# Patient Record
Sex: Female | Born: 1975 | Hispanic: No | Marital: Married | Smoking: Never smoker
Health system: Southern US, Community
[De-identification: ages and names within clinical notes are randomized; demographics above are authoritative.]

## PROBLEM LIST (undated history)

## (undated) DIAGNOSIS — F909 Attention-deficit hyperactivity disorder, unspecified type: Secondary | ICD-10-CM

---

## 2009-08-17 LAB — CONVERTED CEMR LAB

## 2010-05-01 ENCOUNTER — Ambulatory Visit: Payer: Self-pay | Admitting: Family Medicine

## 2010-05-01 DIAGNOSIS — F411 Generalized anxiety disorder: Secondary | ICD-10-CM | POA: Insufficient documentation

## 2010-05-01 DIAGNOSIS — M6740 Ganglion, unspecified site: Secondary | ICD-10-CM | POA: Insufficient documentation

## 2010-05-01 DIAGNOSIS — R51 Headache: Secondary | ICD-10-CM

## 2010-05-01 DIAGNOSIS — R519 Headache, unspecified: Secondary | ICD-10-CM | POA: Insufficient documentation

## 2010-05-01 DIAGNOSIS — D649 Anemia, unspecified: Secondary | ICD-10-CM

## 2010-05-06 ENCOUNTER — Ambulatory Visit: Payer: Self-pay | Admitting: Family Medicine

## 2010-05-06 LAB — CONVERTED CEMR LAB
Glucose, Urine, Semiquant: NEGATIVE
Nitrite: NEGATIVE
Specific Gravity, Urine: 1.02
WBC Urine, dipstick: NEGATIVE
pH: 6.5

## 2010-05-07 LAB — CONVERTED CEMR LAB
Albumin: 4.3 g/dL (ref 3.5–5.2)
Alkaline Phosphatase: 51 units/L (ref 39–117)
Basophils Relative: 0.4 % (ref 0.0–3.0)
CO2: 29 meq/L (ref 19–32)
Calcium: 9.8 mg/dL (ref 8.4–10.5)
Chloride: 102 meq/L (ref 96–112)
Eosinophils Absolute: 0.1 10*3/uL (ref 0.0–0.7)
Glucose, Bld: 84 mg/dL (ref 70–99)
HCT: 39 % (ref 36.0–46.0)
Hemoglobin: 13.4 g/dL (ref 12.0–15.0)
Lymphocytes Relative: 28.6 % (ref 12.0–46.0)
Lymphs Abs: 2.1 10*3/uL (ref 0.7–4.0)
MCHC: 34.4 g/dL (ref 30.0–36.0)
MCV: 96.5 fL (ref 78.0–100.0)
Neutro Abs: 4.6 10*3/uL (ref 1.4–7.7)
Potassium: 5.1 meq/L (ref 3.5–5.1)
RBC: 4.04 M/uL (ref 3.87–5.11)
Sodium: 140 meq/L (ref 135–145)
TSH: 1.63 microintl units/mL (ref 0.35–5.50)
Total CHOL/HDL Ratio: 2
Total Protein: 6.2 g/dL (ref 6.0–8.3)

## 2010-05-26 ENCOUNTER — Telehealth: Payer: Self-pay | Admitting: Family Medicine

## 2010-09-16 NOTE — Progress Notes (Signed)
Summary: seasickness patches  Phone Note Call from Patient Call back at Home Phone [phone]   Caller: Patient Call For: Danise Edge MD Summary of Call: pt is going on 7 day cruise requesting sea sickness patches call into [facility]. Also left same message triage line.  Rudy Jew, RN  May 26, 2010 4:40 PM   Initial call taken by: Heron Sabins,  May 26, 2010 4:24 PM  Follow-up for Phone Call        scopolamine patches change q 3 days. Already sent in rx, please notify Follow-up by: Danise Edge MD,  May 26, 2010 5:06 PM  Additional Follow-up for Phone Call Additional follow up Details #1::        Left message on pts vm stating RX was sent in and to call me if any addt questions. Additional Follow-up by: Josph Macho RMA,  May 27, 2010 9:01 AM    New/Updated Medications: TRANSDERM-SCOP 1.5 MG PT72 (SCOPOLAMINE BASE) 1 patch topically behind ear change every 3 days as needed n/v/disequilibrium Prescriptions: TRANSDERM-SCOP 1.5 MG PT72 (SCOPOLAMINE BASE) 1 patch topically behind ear change every 3 days as needed n/v/disequilibrium  #3 x 0   Entered and Authorized by:   Danise Edge MD   Signed by:   Danise Edge MD on 05/26/2010   Method used:   Electronically to        [facility]  [phone]* (retail)       [address]       [place], [place]  [address]       Ph: [phone] or [phone]       Fax: [phone]   RxID:   [id number]

## 2010-09-16 NOTE — Assessment & Plan Note (Signed)
Summary: BRAND NEW PT/TO EST/CJR   Vital Signs:  Patient profile:   35 year old female Menstrual status:  regular LMP:     04/21/2010 Height:      64 inches (162.56 cm) Weight:      143 pounds (65 kg) BMI:     24.63 O2 Sat:      99 % on Room air Temp:     97.7 degrees F (36.50 degrees C) oral Pulse rate:   67 / minute BP sitting:   102 / 68  (left arm) Cuff size:   regular  Vitals Entered By: Josph Macho RMA (May 01, 2010 10:47 AM)  O2 Flow:  Room air CC: Establish new patient/ Patient has alot of headaches and memory getting bad/ bump on left wrist/ CF Is Patient Diabetic? No LMP (date): 04/21/2010     Menstrual Status regular Enter LMP: 04/21/2010 Last PAP Result historical   History of Present Illness: patient is a 35 year old Caucasian for an inpatient he accompanied by her husband. She moved here in January of 2000 and from [place] to get married he relocate. She has generally been in good health but is presently having some headaches and some itching with forgetfulness she is here for checkup and discussion of some headaches and left wrist lesion. The left wrist lesion is hard mildly tender non-mobile and has been present for roughly a week no history of similar lesions in the past. No warmth, injury, change in the lesion since it started. She is struggling with some headaches on a frequent basis at this time roughly half the days of the week she has a headache to some degree. She started noticing headaches a little over a year ago and initially they were infrequent and swelling have worsened in frequency over the last year. She denied any head injury no history of headaches dating back years. She denies any visual changes, scotomata, photophobia unless the headaches are very extreme pain she has very mild photophobia. She denies photophobia, nausea, vomiting. She had an episode of her left leg feeling slightly numb on the anterior portion of the thigh yesterday but  that was a rigorous workout she rested. Overall she considers her self in good health does give an extensive family history which shows multiple family members with him substance abuse issues the patient herself is that her stroke in regard. She is concerned regarding her memory she will forget her left talus or things that she does not forget important things such as where she is going to pick up her child for details. She takes Excedrin extra strength for her headaches and is generally good relief. She does complain of some mild frontal pressure but no nasal congestion, no fevers, no chills, no ear pain, no throat pain, no chest pain, no palpitations, no shortness of breath, no GI or GU complaints are noted at today's visit. She does have an OB/GYN physician she has been following with.  Preventive Screening-Counseling & Management  Alcohol-Tobacco     Smoking Status: never  Caffeine-Diet-Exercise     Does Patient Exercise: yes      Drug Use:  no.    Current Medications (verified): 1)  Fish Oil .... Once Daily 2)  Multivitamin .... Once Daily 3)  Vitamin D .... Once Daily  Allergies (verified): No Known Drug Allergies  Past History:  Past Surgical History: Appendectomy  Family History: Father: 70, hyperlipidemia Mother: 39, A&W Siblings:  Sister: 54, A&W SSister: 61, A&W MGM: 80s, A&W,  smoker MGF: 28s, DM, stroke, h/o cig and alcohol abuse PGM: deceased@96 , smoker, alcohol abuse, dementia PGF: deceased in 53s, MI, cig and alcohol abuse Children: Daughter: 5yo, alive and well  Social History: Occupation: Futures trader Married Never Smoked Alcohol use-yes Drug use-no Regular seat belt use Regular exercise-yes, 5 days a week, cardio and weight training No dietary restrictions Occupation:  employed Smoking Status:  never Drug Use:  no Does Patient Exercise:  yes  Review of Systems  The patient denies anorexia, fever, weight loss, weight gain, vision loss, decreased  hearing, hoarseness, chest pain, syncope, dyspnea on exertion, peripheral edema, prolonged cough, hemoptysis, abdominal pain, melena, hematochezia, severe indigestion/heartburn, hematuria, incontinence, genital sores, muscle weakness, suspicious skin lesions, transient blindness, difficulty walking, depression, unusual weight change, abnormal bleeding, enlarged lymph nodes, angioedema, breast masses, and testicular masses.    Physical Exam  General:  Well-developed,well-nourished,in no acute distress; alert,appropriate and cooperative throughout examination Head:  Normocephalic and atraumatic without obvious abnormalities. No apparent alopecia or balding. Eyes:  No corneal or conjunctival inflammation noted. EOMI. Perrla. Funduscopic exam benign, without hemorrhages, exudates or papilledema. Vision grossly normal. Ears:  External ear exam shows no significant lesions or deformities.  Otoscopic examination reveals clear canals, tympanic membranes are intact bilaterally without bulging, retraction, inflammation or discharge. Hearing is grossly normal bilaterally. Nose:  External nasal examination shows no deformity or inflammation. Nasal mucosa are pink and moist without lesions or exudates. Mouth:  Oral mucosa and oropharynx without lesions or exudates.  Teeth in good repair. Neck:  No deformities, masses, or tenderness noted. Lungs:  Normal respiratory effort, chest expands symmetrically. Lungs are clear to auscultation, no crackles or wheezes. Heart:  Normal rate and regular rhythm. S1 and S2 normal without gallop, murmur, click, rub or other extra sounds. Abdomen:  Bowel sounds positive,abdomen soft and non-tender without masses, organomegaly or hernias noted. Msk:  No deformity or scoliosis noted of thoracic or lumbar spine.   Pulses:  R and L carotid,radial,femoral,dorsalis pedis and posterior tibial pulses are full and equal bilaterally Extremities:  No clubbing, cyanosis, edema, or deformity  noted with normal full range of motion of all joints.  1 cm firm nodule on left wrist at radial head Neurologic:  No cranial nerve deficits noted. Station and gait are normal. Plantar reflexes are down-going bilaterally. DTRs are symmetrical throughout. Sensory, motor and coordinative functions appear intact. Skin:  Intact without suspicious lesions or rashes Cervical Nodes:  No lymphadenopathy noted Psych:  Cognition and judgment appear intact. Alert and cooperative with normal attention span and concentration. No apparent delusions, illusions, hallucinations   Impression & Recommendations:  Problem # 1:  HEADACHE (ICD-784.0) Likely multifactorial encouraged 7-8 hours of sleep, which she presently does not get, recommend 64 oz of clear fluids and avoid caffeine which she drinks daily. Mostly diet SunDrops. eat small frequent meals and cont Excedrine for now. Will reassess after lifestyle change or if symptoms.  Problem # 2:  GANGLION CYST, WRIST, LEFT (ICD-727.41) Try daily massage with Aspercreme and consider referral later if no resolution  Problem # 3:  Preventive Health Care (ICD-V70.0) Agrees to return for fasting labs and reevaluation after lifestyle changes.  Complete Medication List: 1)  Fish Oil  .... Once daily 2)  Multivitamin  .... Once daily 3)  Vitamin D  .... Once daily  Patient Instructions: 1)  Please schedule a follow-up appointment in 1 month.  2)  BMP prior to visit, ICD-9: v70.0 for all 3)  Hepatic Panel prior to visit  ICD-9:  4)  Lipid panel prior to visit ICD-9 :  5)  TSH prior to visit ICD-9 :  6)  CBC w/ Diff prior to visit ICD-9 :  7)  Stop caffeine ues 8)  Increase clear fluids to 64 oz daily 9)  sleep 7-8 hours each night 10)  Continue regular exercise  Preventive Care Screening  Pap Smear:    Date:  08/17/2009    Results:  historical

## 2011-12-22 ENCOUNTER — Ambulatory Visit: Payer: Self-pay | Admitting: Family

## 2011-12-23 ENCOUNTER — Ambulatory Visit (INDEPENDENT_AMBULATORY_CARE_PROVIDER_SITE_OTHER): Payer: BC Managed Care – PPO | Admitting: Family

## 2011-12-23 ENCOUNTER — Encounter: Payer: Self-pay | Admitting: Family

## 2011-12-23 VITALS — BP 110/60 | Ht 63.25 in | Wt 140.0 lb

## 2011-12-23 DIAGNOSIS — R41840 Attention and concentration deficit: Secondary | ICD-10-CM

## 2011-12-23 MED ORDER — AMPHETAMINE-DEXTROAMPHETAMINE 10 MG PO TABS: 10.0000 mg | ORAL_TABLET | Freq: Two times a day (BID) | ORAL | Status: DC

## 2011-12-23 NOTE — Progress Notes (Signed)
  Subjective:    Patient ID: Andrea Willis, female    DOB: 11/05/1975, 36 y.o.   MRN: [id number]  HPI  36 year old female, nonsmoker, patient to the practice and the concerns of attention deficit. Is having difficulty remembering things and completing tasks. Reports always struggling with this issues as a child. But tended to make A's and B's in school. 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 Her son has ADD and takes Adderall. She denies any feelings of helplessness, hopelessness, thoughts of death or dying.  Review of Systems  Constitutional: Negative.   Respiratory: Negative.   Cardiovascular: Negative.   Gastrointestinal: Negative.   Musculoskeletal: Negative.   Skin: Negative.   Neurological: Negative.   Hematological: Negative.   Psychiatric/Behavioral: Positive for decreased concentration.   No past medical history on file.  History   Social History  . Marital Status: Married    Spouse Name: N/A    Number of Children: N/A  . Years of Education: N/A   Occupational History  . Not on file.   Social History Main Topics  . Smoking status: Never Smoker   . Smokeless tobacco: Not on file  . Alcohol Use: Yes  . Drug Use: No  . Sexually Active: Not on file   Other Topics Concern  . Not on file   Social History Narrative  . No narrative on file    No past surgical history on file.  No family history on file.  No Known Allergies  Current Outpatient Prescriptions on File Prior to Visit  Medication Sig Dispense Refill  . amphetamine-dextroamphetamine (ADDERALL, 10MG ,) 10 MG tablet Take 1 tablet (10 mg total) by mouth 2 (two) times daily.  60 tablet  0    BP 110/60  Ht 5' 3.25" (1.607 m)  Wt 140 lb (63.504 kg)  BMI 24.60 kg/m2chart    Objective:   Physical Exam  Constitutional: She is oriented to person, place, and time. She appears well-developed and well-nourished.  Neck: Normal range of motion. Neck supple.  Cardiovascular:  Normal rate, regular rhythm and normal heart sounds.   Pulmonary/Chest: Effort normal and breath sounds normal.  Musculoskeletal: Normal range of motion.  Neurological: She is alert and oriented to person, place, and time.  Skin: Skin is warm and dry.  Psychiatric: She has a normal mood and affect.          Assessment & Plan:  Assessment: Attention Deficit Disorder  Plan: Will start Adderall 10mg  BID. Recheck  In 3 weeks. Discussed possible side effects and potential for abuse and dependence. Call the office with any questions of concerns.

## 2011-12-23 NOTE — Patient Instructions (Signed)
Attention Deficit Hyperactivity Disorder Attention deficit hyperactivity disorder (ADHD) is a problem with behavior issues based on the way the brain functions (neurobehavioral disorder). It is a common reason for behavior and academic problems in school. CAUSES  The cause of ADHD is unknown in most cases. It may run in families. It sometimes can be associated with learning disabilities and other behavioral problems. SYMPTOMS  There are 3 types of ADHD. The 3 types and some of the symptoms include:  Inattentive   Gets bored or distracted easily.   Loses or forgets things. Forgets to hand in homework.   Has trouble organizing or completing tasks.   Difficulty staying on task.   An inability to organize daily tasks and school work.   Leaving projects, chores, or homework unfinished.   Trouble paying attention or responding to details. Careless mistakes.   Difficulty following directions. Often seems like is not listening.   Dislikes activities that require sustained attention (like chores or homework).   Hyperactive-impulsive   Feels like it is impossible to sit still or stay in a seat. Fidgeting with hands and feet.   Trouble waiting turn.   Talking too much or out of turn. Interruptive.   Speaks or acts impulsively.   Aggressive, disruptive behavior.   Constantly busy or on the go, noisy.   Combined   Has symptoms of both of the above.  Often children with ADHD feel discouraged about themselves and with school. They often perform well below their abilities in school. These symptoms can cause problems in home, school, and in relationships with peers. As children get older, the excess motor activities can calm down, but the problems with paying attention and staying organized persist. Most children do not outgrow ADHD but with good treatment can learn to cope with the symptoms. DIAGNOSIS  When ADHD is suspected, the diagnosis should be made by professionals trained in  ADHD.  Diagnosis will include:  Ruling out other reasons for the child's behavior.   The caregivers will check with the child's school and check their medical records.   They will talk to teachers and parents.   Behavior rating scales for the child will be filled out by those dealing with the child on a daily basis.  A diagnosis is made only after all information has been considered. TREATMENT  Treatment usually includes behavioral treatment often along with medicines. It may include stimulant medicines. The stimulant medicines decrease impulsivity and hyperactivity and increase attention. Other medicines used include antidepressants and certain blood pressure medicines. Most experts agree that treatment for ADHD should address all aspects of the child's functioning. Treatment should not be limited to the use of medicines alone. Treatment should include structured classroom management. The parents must receive education to address rewarding good behavior, discipline, and limit-setting. Tutoring or behavioral therapy or both should be available for the child. If untreated, the disorder can have long-term serious effects into adolescence and adulthood. HOME CARE INSTRUCTIONS   Often with ADHD there is a lot of frustration among the family in dealing with the illness. There is often blame and anger that is not warranted. This is a life long illness. There is no way to prevent ADHD. In many cases, because the problem affects the family as a whole, the entire family may need help. A therapist can help the family find better ways to handle the disruptive behaviors and promote change. If the child is young, most of the therapist's work is with the parents. Parents will   learn techniques for coping with and improving their child's behavior. Sometimes only the child with the ADHD needs counseling. Your caregivers can help you make these decisions.   Children with ADHD may need help in organizing. Some  helpful tips include:   Keep routines the same every day from wake-up time to bedtime. Schedule everything. This includes homework and playtime. This should include outdoor and indoor recreation. Keep the schedule on the refrigerator or a bulletin board where it is frequently seen. Mark schedule changes as far in advance as possible.   Have a place for everything and keep everything in its place. This includes clothing, backpacks, and school supplies.   Encourage writing down assignments and bringing home needed books.   Offer your child a well-balanced diet. Breakfast is especially important for school performance. Children should avoid drinks with caffeine including:   Soft drinks.   Coffee.   Tea.   However, some older children (adolescents) may find these drinks helpful in improving their attention.   Children with ADHD need consistent rules that they can understand and follow. If rules are followed, give small rewards. Children with ADHD often receive, and expect, criticism. Look for good behavior and praise it. Set realistic goals. Give clear instructions. Look for activities that can foster success and self-esteem. Make time for pleasant activities with your child. Give lots of affection.   Parents are their children's greatest advocates. Learn as much as possible about ADHD. This helps you become a stronger and better advocate for your child. It also helps you educate your child's teachers and instructors if they feel inadequate in these areas. Parent support groups are often helpful. A national group with local chapters is called CHADD (Children and Adults with Attention Deficit Hyperactivity Disorder).  PROGNOSIS  There is no cure for ADHD. Children with the disorder seldom outgrow it. Many find adaptive ways to accommodate the ADHD as they mature. SEEK MEDICAL CARE IF:  Your child has repeated muscle twitches, cough or speech outbursts.   Your child has sleep problems.   Your  child has a marked loss of appetite.   Your child develops depression.   Your child has new or worsening behavioral problems.   Your child develops dizziness.   Your child has a racing heart.   Your child has stomach pains.   Your child develops headaches.  Document Released: 07/24/2002 Document Revised: 07/23/2011 Document Reviewed: 03/05/2008 Exit[employer] Patient Information 2012 [employer]. 

## 2012-01-09 ENCOUNTER — Emergency Department (HOSPITAL_COMMUNITY): Payer: BC Managed Care – PPO

## 2012-01-09 ENCOUNTER — Emergency Department (HOSPITAL_COMMUNITY)
Admission: EM | Admit: 2012-01-09 | Discharge: 2012-01-09 | Disposition: A | Discharge status: Home / Self Care | Payer: BC Managed Care – PPO | Attending: Emergency Medicine | Admitting: Emergency Medicine

## 2012-01-09 ENCOUNTER — Encounter (HOSPITAL_COMMUNITY): Payer: Self-pay

## 2012-01-09 DIAGNOSIS — M25511 Pain in right shoulder: Secondary | ICD-10-CM

## 2012-01-09 DIAGNOSIS — W08XXXA Fall from other furniture, initial encounter: Secondary | ICD-10-CM | POA: Insufficient documentation

## 2012-01-09 DIAGNOSIS — M25519 Pain in unspecified shoulder: Secondary | ICD-10-CM | POA: Insufficient documentation

## 2012-01-09 DIAGNOSIS — S060XAA Concussion with loss of consciousness status unknown, initial encounter: Secondary | ICD-10-CM | POA: Insufficient documentation

## 2012-01-09 DIAGNOSIS — S060X9A Concussion with loss of consciousness of unspecified duration, initial encounter: Secondary | ICD-10-CM | POA: Insufficient documentation

## 2012-01-09 DIAGNOSIS — W19XXXA Unspecified fall, initial encounter: Secondary | ICD-10-CM

## 2012-01-09 DIAGNOSIS — F909 Attention-deficit hyperactivity disorder, unspecified type: Secondary | ICD-10-CM | POA: Insufficient documentation

## 2012-01-09 HISTORY — DX: Attention-deficit hyperactivity disorder, unspecified type: F90.9

## 2012-01-09 MED ORDER — TETANUS-DIPHTH-ACELL PERTUSSIS 5-2.5-18.5 LF-MCG/0.5 IM SUSP
0.5000 mL | Freq: Once | INTRAMUSCULAR | Status: AC
  Administered 2012-01-09: 0.5 mL via INTRAMUSCULAR
  Filled 2012-01-09: qty 0.5

## 2012-01-09 MED ORDER — ACETAMINOPHEN 325 MG PO TABS
650.0000 mg | ORAL_TABLET | Freq: Once | ORAL | Status: AC
  Administered 2012-01-09: 650 mg via ORAL
  Filled 2012-01-09: qty 2

## 2012-01-09 MED ORDER — IBUPROFEN 800 MG PO TABS
ORAL_TABLET | ORAL | Status: AC
  Administered 2012-01-09: 800 mg via ORAL
  Filled 2012-01-09: qty 1

## 2012-01-09 NOTE — Discharge Instructions (Signed)
Shoulder Pain The shoulder is a ball and socket joint. The muscles and tendons (rotator cuff) are what keep the shoulder in its joint and stable. This collection of muscles and tendons holds in the head (ball) of the humerus (upper arm bone) in the fossa (cup) of the scapula (shoulder blade). Today no reason was found for your shoulder pain. Often pain in the shoulder may be treated conservatively with temporary immobilization. For example, holding the shoulder in one place using a sling for rest. Physical therapy may be needed if problems continue. HOME CARE INSTRUCTIONS   Apply ice to the sore area for 15 to 20 minutes, 3 to 4 times per day for the first 2 days. Put the ice in a plastic bag. Place a towel between the bag of ice and your skin.   If you have or were given a shoulder sling and straps, do not remove for as long as directed by your caregiver or until you see a caregiver for a follow-up examination. If you need to remove it to shower or bathe, move your arm as little as possible.   Sleep on several pillows at night to lessen swelling and pain.   Only take over-the-counter or prescription medicines for pain, discomfort, or fever as directed by your caregiver.   Keep any follow-up appointments in order to avoid any type of permanent shoulder disability or chronic pain problems.  SEEK MEDICAL CARE IF:   Pain in your shoulder increases or new pain develops in your arm, hand, or fingers.   Your hand or fingers are colder than your other hand.   You do not obtain pain relief with the medications or your pain becomes worse.  SEEK IMMEDIATE MEDICAL CARE IF:   Your arm, hand, or fingers are numb or tingling.   Your arm, hand, or fingers are swollen, painful, or turn white or blue.   You develop chest pain or shortness of breath.  MAKE SURE YOU:   Understand these instructions.   Will watch your condition.   Will get help right away if you are not doing well or get worse.    Document Released: 05/13/2005 Document Revised: 07/23/2011 Document Reviewed: 07/18/2011 [facility] Patient Information 2012 [place], [place].Head Injury, Adult You have had a head injury that does not appear serious at this time. A concussion is a state of changed mental ability, usually from a blow to the head. You should take clear liquids for the rest of the day and then resume your regular diet. You should not take sedatives or alcoholic beverages for as long as directed by your caregiver after discharge. After injuries such as yours, most problems occur within the first 24 hours. SYMPTOMS These minor symptoms may be experienced after discharge:  Memory difficulties.   Dizziness.   Headaches.   Double vision.   Hearing difficulties.   Depression.   Tiredness.   Weakness.   Difficulty with concentration.  If you experience any of these problems, you should not be alarmed. A concussion requires a few days for recovery. Many patients with head injuries frequently experience such symptoms. Usually, these problems disappear without medical care. If symptoms last for more than one day, notify your caregiver. See your caregiver sooner if symptoms are becoming worse rather than better. HOME CARE INSTRUCTIONS   During the next 24 hours you must stay with someone who can watch you for the warning signs listed below.  Although it is unlikely that serious side effects will occur, you should  be aware of signs and symptoms which may necessitate your return to this location. Side effects may occur up to 7 - 10 days following the injury. It is important for you to carefully monitor your condition and contact your caregiver or seek immediate medical attention if there is a change in your condition. SEEK IMMEDIATE MEDICAL CARE IF:   There is confusion or drowsiness.   You can not awaken the injured person.   There is nausea (feeling sick to your stomach) or continued, forceful vomiting.   You  notice dizziness or unsteadiness which is getting worse, or inability to walk.   You have convulsions or unconsciousness.   You experience severe, persistent headaches not relieved by over-the-counter or prescription medicines for pain. (Do not take aspirin as this impairs clotting abilities). Take other pain medications only as directed.   You can not use arms or legs normally.   There is clear or bloody discharge from the nose or ears.  MAKE SURE YOU:   Understand these instructions.   Will watch your condition.   Will get help right away if you are not doing well or get worse.  Document Released: 08/03/2005 Document Revised: 07/23/2011 Document Reviewed: 06/21/2009 [facility] Patient Information 2012 [place], [place].Concussion and Brain Injury A blow or jolt to the head can disrupt the normal function of the brain. This type of brain injury is often called a "concussion" or a "closed head injury." Concussions are usually not life-threatening. Even so, the effects of a concussion can be serious.  CAUSES  A concussion is caused by a blunt blow to the head. The blow might be direct or indirect as described below.  Direct blow (running into another player during a soccer game, being hit in a fight, or hitting your head on a hard surface).   Indirect blow (when your head moves rapidly and violently back and forth like in a car crash).  SYMPTOMS  The brain is very complex. Every head injury is different. Some symptoms may appear right away. Other symptoms may not show up for days or weeks after the concussion. The signs of concussion can be hard to notice. Early on, problems may be missed by patients, family members, and caregivers. You may look fine even though you are acting or feeling differently.  These symptoms are usually temporary, but may last for days, weeks, or even longer. Symptoms include:  Mild headaches that will not go away.   Having more trouble than usual with:    Remembering things.   Paying attention or concentrating.   Organizing daily tasks.   Making decisions and solving problems.   Slowness in thinking, acting, speaking, or reading.   Getting lost or easily confused.   Feeling tired all the time or lacking energy (fatigue).   Feeling drowsy.   Sleep disturbances.   Sleeping more than usual.   Sleeping less than usual.   Trouble falling asleep.   Trouble sleeping (insomnia).   Loss of balance or feeling lightheaded or dizzy.   Nausea or vomiting.   Numbness or tingling.   Increased sensitivity to:   Sounds.   Lights.   Distractions.  Other symptoms might include:  Vision problems or eyes that tire easily.   Diminished sense of taste or smell.   Ringing in the ears.   Mood changes such as feeling sad, anxious, or listless.   Becoming easily irritated or angry for little or no reason.   Lack of motivation.  DIAGNOSIS  Your  caregiver can usually diagnose a concussion or mild brain injury based on your description of your injury and your symptoms.  Your evaluation might include:  A brain scan to look for signs of injury to the brain. Even if the test shows no injury, you may still have a concussion.   Blood tests to be sure other problems are not present.  TREATMENT   People with a concussion need to be examined and evaluated. Most people with concussions are treated in an emergency department, urgent care, or clinic. Some people must stay in the hospital overnight for further treatment.   Your caregiver will send you home with important instructions to follow. Be sure to carefully follow them.   Tell your caregiver if you are already taking any medicines (prescription, over-the-counter, or natural remedies), or if you are drinking alcohol or taking illegal drugs. Also, talk with your caregiver if you are taking blood thinners (anticoagulants) or aspirin. These drugs may increase your chances of  complications. All of this is important information that may affect treatment.   Only take over-the-counter or prescription medicines for pain, discomfort, or fever as directed by your caregiver.  PROGNOSIS  How fast people recover from brain injury varies from person to person. Although most people have a good recovery, how quickly they improve depends on many factors. These factors include how severe their concussion was, what part of the brain was injured, their age, and how healthy they were before the concussion.  Because all head injuries are different, so is recovery. Most people with mild injuries recover fully. Recovery can take time. In general, recovery is slower in older persons. Also, persons who have had a concussion in the past or have other medical problems may find that it takes longer to recover from their current injury. Anxiety and depression may also make it harder to adjust to the symptoms of brain injury. HOME CARE INSTRUCTIONS  Return to your normal activities slowly, not all at once. You must give your body and brain enough time for recovery.  Get plenty of sleep at night, and rest during the day. Rest helps the brain to heal.   Avoid staying up late at night.   Keep the same bedtime hours on weekends and weekdays.   Take daytime naps or rest breaks when you feel tired.   Limit activities that require a lot of thought or concentration (brain or cognitive rest). This includes:   Homework or job-related work.   Watching TV.   Computer work.   Avoid activities that could lead to a second brain injury, such as contact or recreational sports, until your caregiver says it is okay. Even after your brain injury has healed, you should protect yourself from having another concussion.   Ask your caregiver when you can return to your normal activities such as driving, bicycling, or operating heavy equipment. Your ability to react may be slower after a brain injury.   Talk  with your caregiver about when you can return to work or school.   Inform your teachers, school nurse, school counselor, coach, Event organiser, or work Production designer, theatre/television/film about your injury, symptoms, and restrictions. They should be instructed to report:   Increased problems with attention or concentration.   Increased problems remembering or learning new information.   Increased time needed to complete tasks or assignments.   Increased irritability or decreased ability to cope with stress.   Increased symptoms.   Take only those medicines that your caregiver has approved.  Do not drink alcohol until your caregiver says you are well enough to do so. Alcohol and certain other drugs may slow your recovery and can put you at risk of further injury.   If it is harder than usual to remember things, write them down.   If you are easily distracted, try to do one thing at a time. For example, do not try to watch TV while fixing dinner.   Talk with family members or close friends when making important decisions.   Keep all follow-up appointments. Repeated evaluation of your symptoms is recommended for your recovery.  PREVENTION  Protect your head from future injury. It is very important to avoid another head or brain injury before you have recovered. In rare cases, another injury has lead to permanent brain damage, brain swelling, or death. Avoid injuries by using:  Seatbelts when riding in a car.   Alcohol only in moderation.   A helmet when biking, skiing, skateboarding, skating, or doing similar activities.   Safety measures in your home.   Remove clutter and tripping hazards from floors and stairways.   Use grab bars in bathrooms and handrails by stairs.   Place non-slip mats on floors and in bathtubs.   Improve lighting in dim areas.  SEEK MEDICAL CARE IF:  A head injury can cause lingering symptoms. You should seek medical care if you have any of the following symptoms for more than  3 weeks after your injury or are planning to return to sports:  Chronic headaches.   Dizziness or balance problems.   Nausea.   Vision problems.   Increased sensitivity to noise or light.   Depression or mood swings.   Anxiety or irritability.   Memory problems.   Difficulty concentrating or paying attention.   Sleep problems.   Feeling tired all the time.  SEEK IMMEDIATE MEDICAL CARE IF:  You have had a blow or jolt to the head and you (or your family or friends) notice:  Severe or worsening headaches.   Weakness (even if only in one hand or one leg or one part of the face), numbness, or decreased coordination.   Repeated vomiting.   Increased sleepiness or passing out.   One black center of the eye (pupil) is larger than the other.   Convulsions (seizures).   Slurred speech.   Increasing confusion, restlessness, agitation, or irritability.   Lack of ability to recognize people or places.   Neck pain.   Difficulty being awakened.   Unusual behavior changes.   Loss of consciousness.  Older adults with a brain injury may have a higher risk of serious complications such as a blood clot on the brain. Headaches that get worse or an increase in confusion are signs of this complication. If these signs occur, see a caregiver right away. MAKE SURE YOU:   Understand these instructions.   Will watch your condition.   Will get help right away if you are not doing well or get worse.  FOR MORE INFORMATION  Several groups help people with brain injury and their families. They provide information and put people in touch with local resources. These include support groups, rehabilitation services, and a variety of health care professionals. Among these groups, the Brain Injury Association (BIA, www.biausa.org) has a Secretary/administrator that gathers scientific and educational information and works on a national level to help people with brain injury.  Document Released:  10/24/2003 Document Revised: 07/23/2011 Document Reviewed: 03/21/2008 [facility] Patient Information 2012 [place]. 

## 2012-01-09 NOTE — ED Provider Notes (Signed)
History     CSN: [id number]  Arrival date & time 01/09/12  4098   First MD Initiated Contact with Patient 01/09/12 1830      Chief Complaint  Patient presents with  . Fall    (Consider location/radiation/quality/duration/timing/severity/associated sxs/prior treatment) HPI Comments: Larey Seat from bench swing and landed on all 4s.  Contrary to nursing note.  Confirmed with EMS.  Bench seat was 6 feet above the dirt on which the pt landed.  Did not land head first.  Pt was initial confused but now feeling better.  Patient is a 36 y.o. female presenting with fall. The history is provided by the patient.  Fall The accident occurred less than 1 hour ago. Incident: swinging on a bench swing and fell onto dirt. Distance fallen: 6 feet. She landed on dirt. There was no blood loss. Point of impact: landed on all 4s. Pain location: mild headache and mild right shoulder pain. The pain is mild. Pertinent negatives include no abdominal pain.    Past Medical History  Diagnosis Date  . Attention deficit hyperactivity disorder (ADHD)     History reviewed. No pertinent past surgical history.  History reviewed. No pertinent family history.  History  Substance Use Topics  . Smoking status: Never Smoker   . Smokeless tobacco: Not on file  . Alcohol Use: Yes    OB History    Grav Para Term Preterm Abortions TAB SAB Ect Mult Living                  Review of Systems  Constitutional: Negative for activity change.  HENT: Negative for congestion.   Eyes: Negative for visual disturbance.  Respiratory: Negative for chest tightness and shortness of breath.   Cardiovascular: Negative for chest pain and leg swelling.  Gastrointestinal: Negative for abdominal pain.  Genitourinary: Negative for dysuria.  Skin: Negative for rash.  Neurological: Negative for syncope.  Psychiatric/Behavioral: Negative for behavioral problems.    Allergies  Review of patient's allergies indicates no known  allergies.  Home Medications   Current Outpatient Rx  Name Route Sig Dispense Refill  . AMPHETAMINE-DEXTROAMPHET ER 20 MG PO CP24 Oral Take 20 mg by mouth daily.    . AMPHETAMINE-DEXTROAMPHETAMINE 10 MG PO TABS Oral Take 10 mg by mouth daily.    Marland Kitchen CRANBERRY PO Oral Take 1 capsule by mouth daily.    . OMEGA-3 FATTY ACIDS 1000 MG PO CAPS Oral Take 1 g by mouth daily.    Carma Leaven M PLUS PO TABS Oral Take 1 tablet by mouth daily.      LMP 12/11/2011  Physical Exam  Constitutional: She is oriented to person, place, and time. She appears well-developed and well-nourished.  HENT:  Head: Normocephalic and atraumatic.  Eyes: Conjunctivae and EOM are normal. Pupils are equal, round, and reactive to light. No scleral icterus.  Neck: Normal range of motion. Neck supple.  Cardiovascular: Normal rate and regular rhythm.  Exam reveals no gallop and no friction rub.   No murmur heard. Pulmonary/Chest: Effort normal and breath sounds normal. No respiratory distress. She has no wheezes. She has no rales. She exhibits no tenderness.  Abdominal: Soft. She exhibits no distension and no mass. There is no tenderness. There is no rebound and no guarding.  Musculoskeletal: Normal range of motion.       Mild ttp in R shoulder.  No C, T, L spine ttp.  Neurological: She is alert and oriented to person, place, and time. She has normal  reflexes. No cranial nerve deficit.  Skin: Skin is warm and dry. No rash noted.       Several small abrasions  Psychiatric: She has a normal mood and affect. Her behavior is normal. Judgment and thought content normal.    ED Course  Procedures (including critical care time)  Labs Reviewed - No data to display Dg Shoulder Right  01/09/2012  *RADIOLOGY REPORT*  Clinical Data: Fall off swing onto right shoulder.  Shoulder pain.  RIGHT SHOULDER - 2+ VIEW  Comparison:  None.  Findings:  There is no evidence of fracture or dislocation.  There is no evidence of arthropathy or other  focal bone abnormality. Soft tissues are unremarkable.  IMPRESSION: Negative.  Original Report Authenticated By: Danae Orleans, M.D.     1. Concussion   2. Right shoulder pain   3. Fall       MDM  Fell from bench swing and landed on all 4s.  Contrary to nursing note.  Confirmed with EMS.  Bench seat was 6 feet above the dirt on which the pt landed.  Did not land head first.  Pt was initially confused but now feeling better.  VSS and well appearing.  Mild ttp in R shoulder and a few small abrasions.  Otherwise without injury.  Shoulder xray without fx.  Presentation c/w concussion.  Minimal headache at this time.  Ambulatory in ED without difficulty.  Normal neuro exam.   Offered head CT but pt felt comfortable with d/c home.  Gave had injury precautions.  Gradual return of activity and PCP f/u.  Pt comfortable with plan and will follow up.         Army Chaco, MD 01/09/12 2115

## 2012-01-09 NOTE — ED Notes (Signed)
Pt fell 10 feet to the ground from a fall off a swing, confusion in the beginning, just now rememebering what month it is

## 2012-01-10 NOTE — ED Provider Notes (Signed)
I saw and evaluated the patient, reviewed the resident's note and I agree with the findings and plan.   .Face to face Exam:  General:  Awake HEENT:  Atraumatic Resp:  Normal effort Abd:  Nondistended Neuro:No focal weakness Lymph: No adenopathy   Nelia Shi, MD 01/10/12 [phone]

## 2012-01-11 ENCOUNTER — Emergency Department (HOSPITAL_BASED_OUTPATIENT_CLINIC_OR_DEPARTMENT_OTHER)
Admission: EM | Admit: 2012-01-11 | Discharge: 2012-01-11 | Disposition: A | Discharge status: Home / Self Care | Payer: BC Managed Care – PPO | Attending: Emergency Medicine | Admitting: Emergency Medicine

## 2012-01-11 ENCOUNTER — Emergency Department (HOSPITAL_BASED_OUTPATIENT_CLINIC_OR_DEPARTMENT_OTHER): Payer: BC Managed Care – PPO

## 2012-01-11 ENCOUNTER — Encounter (HOSPITAL_BASED_OUTPATIENT_CLINIC_OR_DEPARTMENT_OTHER): Payer: Self-pay | Admitting: *Deleted

## 2012-01-11 DIAGNOSIS — S0990XA Unspecified injury of head, initial encounter: Secondary | ICD-10-CM | POA: Insufficient documentation

## 2012-01-11 DIAGNOSIS — W07XXXA Fall from chair, initial encounter: Secondary | ICD-10-CM | POA: Insufficient documentation

## 2012-01-11 DIAGNOSIS — F0781 Postconcussional syndrome: Secondary | ICD-10-CM | POA: Insufficient documentation

## 2012-01-11 DIAGNOSIS — R404 Transient alteration of awareness: Secondary | ICD-10-CM | POA: Insufficient documentation

## 2012-01-11 MED ORDER — HYDROCODONE-ACETAMINOPHEN 5-325 MG PO TABS
2.0000 | ORAL_TABLET | ORAL | Status: AC | PRN
Start: 2012-01-11 — End: 2012-01-21

## 2012-01-11 NOTE — ED Notes (Signed)
Pt states she fell out of a swing hitting her head and had loc. Taken by ems to [facility], dx with concussion. Pt states she did not have a head ct, and is requesting that today. Pt states she cont to have ha.

## 2012-01-11 NOTE — Discharge Instructions (Signed)
.  Post-Concussion Syndrome   Post-concussion syndrome means you have problems after a head injury. The problems can last for weeks or months. The problems usually go away on their own over time. HOME CARE   Only take medicines as told by your doctor. Do not take aspirin.   Sleep with your head raised (elevated) to help with headaches.   Avoid activities that can cause another head injury. Do not play football, hockey, do martial arts, or ride horses until your doctor says it is okay.   Keep all doctor visits as told.  GET HELP RIGHT AWAY IF:  You feel confused or very sleepy.   You cannot wake the injured person.   You feel sick to your stomach (nauseous) or keep throwing up (vomiting).   You feel like you are moving when you are not (vertigo).   You notice the injured person's eyes moving back and forth very fast.   You start shaking (convulsions) or pass out (faint).   You have very bad headaches that do not get better with medicine.   You cannot use your arms or legs normally.   The black center of your eyes (pupils) change size.   You have clear or bloody fluid coming from your nose or ears.   Your problems get worse, not better.  MAKE SURE YOU:  Understand these instructions.   Will watch your condition.   Will get help right away if you are not doing well or get worse.  Document Released: 09/10/2004 Document Revised: 07/23/2011 Document Reviewed: 02/19/2011 [facility] Patient Information 2012 [place], [place].

## 2012-01-11 NOTE — ED Provider Notes (Signed)
History  This chart was scribed for Andrea Shi, MD by Cherlynn Perches. The patient was seen in room MH02/MH02. Patient's care was started at 1656.  CSN: [id number]  Arrival date & time 01/11/12  1656   First MD Initiated Contact with Patient 01/11/12 1727      Chief Complaint  Patient presents with  . Head Injury    (Consider location/radiation/quality/duration/timing/severity/associated sxs/prior treatment) HPI  Najee Willis is a 36 y.o. female who presents to the Emergency Department complaining of 2 days of sudden onset, unchanged, constant headache with associated "wooziness" and light sensitivity. Pt was seen at [facility] 2 days ago after falling off a swing and losing consciousness. Pt reports that she was on a bench swing when the chain broke. She hit her head on the back of the chair and fell out of the chair onto her head and shoulder. Onlookers report that pt lost consciousness for about 20 seconds. A head CT was not performed in the [facility] ED, but patient was told to return if symptoms persist. Pt denies nausea, vomiting, fever, and further episodes of syncope. Pt has been taking ibuprofen for pain.  History reviewed. No pertinent past medical history.  History reviewed. No pertinent past surgical history.  History reviewed. No pertinent family history.  History  Substance Use Topics  . Smoking status: Not on file  . Smokeless tobacco: Not on file  . Alcohol Use: Not on file    OB History    Grav Para Term Preterm Abortions TAB SAB Ect Mult Living                  Review of Systems  Constitutional: Negative for fever and chills.  Respiratory: Negative for cough and shortness of breath.   Cardiovascular: Negative for chest pain.  Gastrointestinal: Negative for nausea, vomiting, abdominal pain and diarrhea.  Musculoskeletal: Negative for back pain.  Skin: Negative for rash.  Neurological: Positive for syncope (2 days ago), light-headedness ("woozy") and  headaches.       "wooziness"  Psychiatric/Behavioral: Negative for confusion.  All other systems reviewed and are negative.    Allergies  Review of patient's allergies indicates no known allergies.  Home Medications   Current Outpatient Rx  Name Route Sig Dispense Refill  . AMPHETAMINE-DEXTROAMPHETAMINE 10 MG PO TABS Oral Take 10 mg by mouth daily.    Marland Kitchen CRANBERRY PO Oral Take 2 tablets by mouth daily.    . IBUPROFEN 200 MG PO TABS Oral Take 800 mg by mouth every 6 (six) hours as needed. Patient used this medication for her headache.    . ONE-DAILY MULTI VITAMINS PO TABS Oral Take 1 tablet by mouth daily.    Marland Kitchen FISH OIL PO Oral Take 1 capsule by mouth daily.    Marland Kitchen HYDROCODONE-ACETAMINOPHEN 5-325 MG PO TABS Oral Take 2 tablets by mouth every 4 (four) hours as needed for pain. 10 tablet 0    Triage Vitals: BP 122/68  Pulse 90  Temp(Src) 98.4 F (36.9 C) (Oral)  Resp 20  Ht 5\' 3"  (1.6 m)  Wt 140 lb (63.504 kg)  BMI 24.80 kg/m2  SpO2 98%  LMP 12/12/2011  Physical Exam  Nursing note and vitals reviewed. Constitutional: She is oriented to person, place, and time. She appears well-developed and well-nourished. No distress.  HENT:  Head: Normocephalic and atraumatic.  Eyes: EOM are normal. Pupils are equal, round, and reactive to light.  Neck: Normal range of motion.  Cardiovascular: Normal rate and intact  distal pulses.   Pulmonary/Chest: No respiratory distress.  Abdominal: Normal appearance. She exhibits no distension.  Musculoskeletal: Normal range of motion.  Neurological: She is alert and oriented to person, place, and time. She has normal strength. No cranial nerve deficit or sensory deficit. Coordination normal. GCS eye subscore is 4. GCS verbal subscore is 5. GCS motor subscore is 6.       PERRL, EOM intact, finger to nose normal, cranial nerves 2-12 intact, normal gate  Skin: Skin is warm and dry. No rash noted.  Psychiatric: She has a normal mood and affect. Her  behavior is normal.    ED Course  Procedures (including critical care time)  DIAGNOSTIC STUDIES: Oxygen Saturation is 98% on room air, normal by my interpretation.    COORDINATION OF CARE: 5:33PM - Discussed exam findings with pt. Will get head CT. Pt agrees.    Labs Reviewed - No data to display Ct Head Wo Contrast  01/11/2012  *RADIOLOGY REPORT*  Clinical Data:  Larey Seat from swing  hitting head with loss of consciousness 01/09/2012.  Continued headache.  CT HEAD WITHOUT CONTRAST  Technique:  Contiguous axial images were obtained from the base of the skull through the vertex without contrast  Comparison:  None.  Findings:  The brain has a normal appearance without evidence for hemorrhage, acute infarction, hydrocephalus, or mass lesion.  There is no extra axial fluid collection.  The skull and paranasal sinuses are normal.  IMPRESSION: Normal CT of the head without contrast.  Original Report Authenticated By: Elsie Stain, M.D.     1. Postconcussive syndrome       MDM  I personally performed the services described in this documentation, which was scribed in my presence. The recorded information has been reviewed and considered.    Andrea Shi, MD 01/11/12 [phone]

## 2012-01-11 NOTE — ED Notes (Signed)
rx x 1 given for hydrocodone- ice pack given for home use- d/c with friend

## 2012-01-12 ENCOUNTER — Ambulatory Visit: Payer: BC Managed Care – PPO | Admitting: Family

## 2012-01-12 ENCOUNTER — Telehealth: Payer: Self-pay | Admitting: Family Medicine

## 2012-01-12 ENCOUNTER — Encounter (HOSPITAL_COMMUNITY): Payer: Self-pay

## 2012-01-12 NOTE — Telephone Encounter (Signed)
[facility] Triage Call Report Triage Record Num: [id number] Operator: Amy Head Patient Name: Andrea Willis Call Date & Time: 01/11/2012 4:01:51PM Patient Phone: [phone] PCP: Neta Mends. Panosh Patient Gender: Female PCP Fax : [phone] Patient DOB: 09-May-1976 [facility] Name: [facility] Reason for Call: Caller: Ilda Basset; PCP: Madelin Headings.; CB#: [phone]; Call regarding Head injury saturday;; DX with Concussion at [facility] on Saturday, 01/09/12. No MRI was done. Started breathing "funny" last night, "her head isn't getting any better and she is walking funny." Pt was on a swing at the park and chain broke, landed "like a rag doll", hitting her head neck and arms. LOC, memory loss which has since come back. Wants MRI done. Was told that if pt had any further sxs to call back. Called [facility] on [address] and they stated that pt needs a MD order. Is dizzy, can stand but not for long periods of time, needs assistance. Having trouble with balance and severe HA still. Spoke with Dr. Cato Mulligan, advised to go back to ED for evaluation and states that he can not order a CT or MRI without seeing the pt. Protocol(s) Used: Head Injury Recommended Outcome per Protocol: See ED Immediately Reason for Outcome: Severe or worsening headache since injury Care Advice: ~ Another adult should drive. ~ Do not give the patient anything to eat or drink. After a head or face injury, call EMS 911 if the person is unable to answer simple questions, can't waken easily, has change in behavior, vomits more than 2 hours after injury, has double vision or new onset of difficulty walking, weakness or numbness. ~ ~ IMMEDIATE ACTION Write down provider's name. List or place the following in a bag for transport with the patient: current prescription and/or nonprescription medications; alternative treatments, therapies and medications; and street drugs. ~ Call EMS 911 if  having a sudden, severe disabling headache OR if the person spontaneously verbalized this headache is "the worst headache of my life." ~ 01/11/2012 4:17:41PM Page 1 of 1 [id number]

## 2012-01-19 ENCOUNTER — Telehealth: Payer: Self-pay

## 2012-01-19 ENCOUNTER — Ambulatory Visit (INDEPENDENT_AMBULATORY_CARE_PROVIDER_SITE_OTHER): Payer: BC Managed Care – PPO | Admitting: Family

## 2012-01-19 ENCOUNTER — Encounter: Payer: Self-pay | Admitting: Family

## 2012-01-19 VITALS — BP 100/60 | Temp 98.4°F | Wt 142.5 lb

## 2012-01-19 DIAGNOSIS — S060X9A Concussion with loss of consciousness of unspecified duration, initial encounter: Secondary | ICD-10-CM

## 2012-01-19 DIAGNOSIS — F988 Other specified behavioral and emotional disorders with onset usually occurring in childhood and adolescence: Secondary | ICD-10-CM

## 2012-01-19 MED ORDER — AMPHETAMINE-DEXTROAMPHET ER 20 MG PO CP24: 20.0000 mg | ORAL_CAPSULE | ORAL | Status: DC

## 2012-01-19 NOTE — Telephone Encounter (Signed)
Pt was seen today and forgot to mention that she had a concussion 10 days ago.  A swing broke at the park and she was hit in the head and knocked unconscious.  She wants to know when it would be ok for her to go back to normal activity, including sex.

## 2012-01-19 NOTE — Patient Instructions (Signed)
Concussion and Brain Injury A blow or jolt to the head can disrupt the normal function of the brain. This type of brain injury is often called a "concussion" or a "closed head injury." Concussions are usually not life-threatening. Even so, the effects of a concussion can be serious.  CAUSES  A concussion is caused by a blunt blow to the head. The blow might be direct or indirect as described below.  Direct blow (running into another player during a soccer game, being hit in a fight, or hitting your head on a hard surface).   Indirect blow (when your head moves rapidly and violently back and forth like in a car crash).  SYMPTOMS  The brain is very complex. Every head injury is different. Some symptoms may appear right away. Other symptoms may not show up for days or weeks after the concussion. The signs of concussion can be hard to notice. Early on, problems may be missed by patients, family members, and caregivers. You may look fine even though you are acting or feeling differently.  These symptoms are usually temporary, but may last for days, weeks, or even longer. Symptoms include:  Mild headaches that will not go away.   Having more trouble than usual with:   Remembering things.   Paying attention or concentrating.   Organizing daily tasks.   Making decisions and solving problems.   Slowness in thinking, acting, speaking, or reading.   Getting lost or easily confused.   Feeling tired all the time or lacking energy (fatigue).   Feeling drowsy.   Sleep disturbances.   Sleeping more than usual.   Sleeping less than usual.   Trouble falling asleep.   Trouble sleeping (insomnia).   Loss of balance or feeling lightheaded or dizzy.   Nausea or vomiting.   Numbness or tingling.   Increased sensitivity to:   Sounds.   Lights.   Distractions.  Other symptoms might include:  Vision problems or eyes that tire easily.   Diminished sense of taste or smell.   Ringing  in the ears.   Mood changes such as feeling sad, anxious, or listless.   Becoming easily irritated or angry for little or no reason.   Lack of motivation.  DIAGNOSIS  Your caregiver can usually diagnose a concussion or mild brain injury based on your description of your injury and your symptoms.  Your evaluation might include:  A brain scan to look for signs of injury to the brain. Even if the test shows no injury, you may still have a concussion.   Blood tests to be sure other problems are not present.  TREATMENT   People with a concussion need to be examined and evaluated. Most people with concussions are treated in an emergency department, urgent care, or clinic. Some people must stay in the hospital overnight for further treatment.   Your caregiver will send you home with important instructions to follow. Be sure to carefully follow them.   Tell your caregiver if you are already taking any medicines (prescription, over-the-counter, or natural remedies), or if you are drinking alcohol or taking illegal drugs. Also, talk with your caregiver if you are taking blood thinners (anticoagulants) or aspirin. These drugs may increase your chances of complications. All of this is important information that may affect treatment.   Only take over-the-counter or prescription medicines for pain, discomfort, or fever as directed by your caregiver.  PROGNOSIS  How fast people recover from brain injury varies from person to person.   Although most people have a good recovery, how quickly they improve depends on many factors. These factors include how severe their concussion was, what part of the brain was injured, their age, and how healthy they were before the concussion.  Because all head injuries are different, so is recovery. Most people with mild injuries recover fully. Recovery can take time. In general, recovery is slower in older persons. Also, persons who have had a concussion in the past or have  other medical problems may find that it takes longer to recover from their current injury. Anxiety and depression may also make it harder to adjust to the symptoms of brain injury. HOME CARE INSTRUCTIONS  Return to your normal activities slowly, not all at once. You must give your body and brain enough time for recovery.  Get plenty of sleep at night, and rest during the day. Rest helps the brain to heal.   Avoid staying up late at night.   Keep the same bedtime hours on weekends and weekdays.   Take daytime naps or rest breaks when you feel tired.   Limit activities that require a lot of thought or concentration (brain or cognitive rest). This includes:   Homework or job-related work.   Watching TV.   Computer work.   Avoid activities that could lead to a second brain injury, such as contact or recreational sports, until your caregiver says it is okay. Even after your brain injury has healed, you should protect yourself from having another concussion.   Ask your caregiver when you can return to your normal activities such as driving, bicycling, or operating heavy equipment. Your ability to react may be slower after a brain injury.   Talk with your caregiver about when you can return to work or school.   Inform your teachers, school nurse, school counselor, coach, athletic trainer, or work manager about your injury, symptoms, and restrictions. They should be instructed to report:   Increased problems with attention or concentration.   Increased problems remembering or learning new information.   Increased time needed to complete tasks or assignments.   Increased irritability or decreased ability to cope with stress.   Increased symptoms.   Take only those medicines that your caregiver has approved.   Do not drink alcohol until your caregiver says you are well enough to do so. Alcohol and certain other drugs may slow your recovery and can put you at risk of further injury.    If it is harder than usual to remember things, write them down.   If you are easily distracted, try to do one thing at a time. For example, do not try to watch TV while fixing dinner.   Talk with family members or close friends when making important decisions.   Keep all follow-up appointments. Repeated evaluation of your symptoms is recommended for your recovery.  PREVENTION  Protect your head from future injury. It is very important to avoid another head or brain injury before you have recovered. In rare cases, another injury has lead to permanent brain damage, brain swelling, or death. Avoid injuries by using:  Seatbelts when riding in a car.   Alcohol only in moderation.   A helmet when biking, skiing, skateboarding, skating, or doing similar activities.   Safety measures in your home.   Remove clutter and tripping hazards from floors and stairways.   Use grab bars in bathrooms and handrails by stairs.   Place non-slip mats on floors and in bathtubs.     Improve lighting in dim areas.  SEEK MEDICAL CARE IF:  A head injury can cause lingering symptoms. You should seek medical care if you have any of the following symptoms for more than 3 weeks after your injury or are planning to return to sports:  Chronic headaches.   Dizziness or balance problems.   Nausea.   Vision problems.   Increased sensitivity to noise or light.   Depression or mood swings.   Anxiety or irritability.   Memory problems.   Difficulty concentrating or paying attention.   Sleep problems.   Feeling tired all the time.  SEEK IMMEDIATE MEDICAL CARE IF:  You have had a blow or jolt to the head and you (or your family or friends) notice:  Severe or worsening headaches.   Weakness (even if only in one hand or one leg or one part of the face), numbness, or decreased coordination.   Repeated vomiting.   Increased sleepiness or passing out.   One black center of the eye (pupil) is larger  than the other.   Convulsions (seizures).   Slurred speech.   Increasing confusion, restlessness, agitation, or irritability.   Lack of ability to recognize people or places.   Neck pain.   Difficulty being awakened.   Unusual behavior changes.   Loss of consciousness.  Older adults with a brain injury may have a higher risk of serious complications such as a blood clot on the brain. Headaches that get worse or an increase in confusion are signs of this complication. If these signs occur, see a caregiver right away. MAKE SURE YOU:   Understand these instructions.   Will watch your condition.   Will get help right away if you are not doing well or get worse.  FOR MORE INFORMATION  Several groups help people with brain injury and their families. They provide information and put people in touch with local resources. These include support groups, rehabilitation services, and a variety of health care professionals. Among these groups, the Brain Injury Association (BIA, www.biausa.org) has a national office that gathers scientific and educational information and works on a national level to help people with brain injury.  Document Released: 10/24/2003 Document Revised: 07/23/2011 Document Reviewed: 03/21/2008 ExitCare Patient Information 2012 [employer]. 

## 2012-01-19 NOTE — Telephone Encounter (Signed)
Per Oran Rein, advised pt to advance activities within comfortable limits, however DO NOT HIT YOUR HEAD!. Pt is aware and verbalized understanding

## 2012-01-19 NOTE — Progress Notes (Signed)
Subjective:    Patient ID: Andrea Willis, female    DOB: [birth year], 36 y.o.   MRN: [id number]  HPI 36 year old white female, nonsmoker is in following a traumatic brain injury/concussion when she fell out of the swimming week ago. She was seen at the emergency department. She had a negative CT scan. The patient lost consciousness, and had decreased awareness. She has since recovered. She denies any lightheadedness, dizziness, chest pain, palpitations, shortness of breath or edema. Since the accident, she's had a constant headache rate it a 3/10.   Review of Systems  Constitutional: Negative.   Eyes: Negative.  Negative for visual disturbance.  Respiratory: Negative.   Cardiovascular: Negative.   Gastrointestinal: Negative.   Musculoskeletal: Negative.   Skin: Negative.   Neurological: Positive for headaches.  Hematological: Negative.   Psychiatric/Behavioral: Negative.    Past Medical History  Diagnosis Date  . Attention deficit hyperactivity disorder (ADHD)     History   Social History  . Marital Status: Married    Spouse Name: N/A    Number of Children: N/A  . Years of Education: N/A   Occupational History  . Not on file.   Social History Main Topics  . Smoking status: Never Smoker   . Smokeless tobacco: Not on file  . Alcohol Use: Yes  . Drug Use: No  . Sexually Active: Not on file   Other Topics Concern  . Not on file   Social History Narrative   ** Merged History Encounter **     No past surgical history on file.  No family history on file.  No Known Allergies  Current Outpatient Prescriptions on File Prior to Visit  Medication Sig Dispense Refill  . CRANBERRY PO Take 1 capsule by mouth daily.      Marland Kitchen CRANBERRY PO Take 2 tablets by mouth daily.      . fish oil-omega-3 fatty acids 1000 MG capsule Take 1 g by mouth daily.      Marland Kitchen HYDROcodone-acetaminophen (NORCO) 5-325 MG per tablet Take 2 tablets by mouth every 4 (four) hours as needed for pain.  10  tablet  0  . ibuprofen (ADVIL,MOTRIN) 200 MG tablet Take 800 mg by mouth every 6 (six) hours as needed. Patient used this medication for her headache.      . Multiple Vitamin (MULTIVITAMIN) tablet Take 1 tablet by mouth daily.      . Multiple Vitamins-Minerals (MULTIVITAMINS THER. W/MINERALS) TABS Take 1 tablet by mouth daily.      . Omega-3 Fatty Acids (FISH OIL PO) Take 1 capsule by mouth daily.      Marland Kitchen DISCONTD: amphetamine-dextroamphetamine (ADDERALL XR) 20 MG 24 hr capsule Take 20 mg by mouth daily.      Marland Kitchen DISCONTD: amphetamine-dextroamphetamine (ADDERALL) 10 MG tablet Take 10 mg by mouth daily.      Marland Kitchen DISCONTD: amphetamine-dextroamphetamine (ADDERALL) 10 MG tablet Take 10 mg by mouth daily.        BP 100/60  Temp(Src) 98.4 F (36.9 C) (Oral)  Wt 142 lb 8 oz (64.638 kg)  LMP 04/29/2013chart    Objective:   Physical Exam  Constitutional: She is oriented to person, place, and time. She appears well-developed and well-nourished.  HENT:  Head: Normocephalic and atraumatic.  Right Ear: External ear normal.  Left Ear: External ear normal.  Nose: Nose normal.  Mouth/Throat: Oropharynx is clear and moist.  Eyes: Conjunctivae are normal.  Neck: Normal range of motion. Neck supple.  Cardiovascular: Normal rate, regular rhythm and  normal heart sounds.   Pulmonary/Chest: Effort normal and breath sounds normal.  Neurological: She is alert and oriented to person, place, and time. She has normal reflexes. She displays normal reflexes. No cranial nerve deficit. Coordination normal.  Skin: Skin is warm and dry.  Psychiatric: She has a normal mood and affect.          Assessment & Plan:  Assessment: Hospital F/U, Concussion, headache, ADD  Plan: Educated patient on traumatic brain injuries. She's aware that her headache and linger for up to 3 months. I've advised that she refrain from doing any activities that could potentially cause a second brain injury. She is currently off of her  Adderall but will resume the medication in a week. Patient advised to call the office with any questions or concerns. Recheck in 3 months and sooner when necessary.

## 2012-02-29 ENCOUNTER — Telehealth: Payer: Self-pay | Admitting: Family

## 2012-02-29 MED ORDER — AMPHETAMINE-DEXTROAMPHET ER 20 MG PO CP24: 20.0000 mg | ORAL_CAPSULE | ORAL | Status: DC

## 2012-02-29 NOTE — Telephone Encounter (Signed)
Rx printed and pt aware ready to pick up

## 2012-02-29 NOTE — Telephone Encounter (Signed)
Patient called stating that she need a refill of her adderall and she will be going out of town on Wednesday. Please assist and inform patient when available for pick up.

## 2012-04-01 ENCOUNTER — Telehealth: Payer: Self-pay | Admitting: Family

## 2012-04-01 ENCOUNTER — Other Ambulatory Visit: Payer: Self-pay | Admitting: Family Medicine

## 2012-04-01 MED ORDER — AMPHETAMINE-DEXTROAMPHET ER 20 MG PO CP24: 20.0000 mg | ORAL_CAPSULE | ORAL | Status: DC

## 2012-04-01 NOTE — Telephone Encounter (Signed)
Pt requesting a refill on amphetamine-dextroamphetamine (ADDERALL XR) 20 MG 24 hr capsule

## 2012-04-01 NOTE — Telephone Encounter (Signed)
Printed for PC to sign.  Patient here in the office to pick it up.

## 2012-05-09 ENCOUNTER — Telehealth: Payer: Self-pay | Admitting: Family

## 2012-05-09 MED ORDER — AMPHETAMINE-DEXTROAMPHET ER 20 MG PO CP24: 20.0000 mg | ORAL_CAPSULE | ORAL | Status: DC

## 2012-05-09 NOTE — Telephone Encounter (Signed)
Pt called to req refill of amphetamine-dextroamphetamine (ADDERALL XR) 20 MG 24 hr capsule

## 2012-06-10 ENCOUNTER — Telehealth: Payer: Self-pay | Admitting: Family

## 2012-06-10 MED ORDER — AMPHETAMINE-DEXTROAMPHET ER 20 MG PO CP24: 20.0000 mg | ORAL_CAPSULE | ORAL | Status: DC

## 2012-06-10 NOTE — Telephone Encounter (Signed)
Patient called stating that she need a refill of her adderall. Please assist.  °

## 2012-06-10 NOTE — Telephone Encounter (Signed)
Rx printed and ready for pick-up and pt aware

## 2012-07-19 ENCOUNTER — Encounter: Payer: Self-pay | Admitting: Family

## 2012-07-19 ENCOUNTER — Other Ambulatory Visit: Payer: Self-pay | Admitting: Family

## 2012-07-19 ENCOUNTER — Ambulatory Visit (INDEPENDENT_AMBULATORY_CARE_PROVIDER_SITE_OTHER): Payer: BC Managed Care – PPO | Admitting: Family

## 2012-07-19 VITALS — BP 122/68 | HR 92 | Temp 98.7°F | Wt 139.0 lb

## 2012-07-19 DIAGNOSIS — R5383 Other fatigue: Secondary | ICD-10-CM

## 2012-07-19 DIAGNOSIS — R110 Nausea: Secondary | ICD-10-CM

## 2012-07-19 DIAGNOSIS — R5381 Other malaise: Secondary | ICD-10-CM

## 2012-07-19 LAB — CBC WITH DIFFERENTIAL/PLATELET
Basophils Absolute: 0 10*3/uL (ref 0.0–0.1)
Eosinophils Absolute: 0 10*3/uL (ref 0.0–0.7)
Eosinophils Relative: 0.2 % (ref 0.0–5.0)
MCV: 96.1 fl (ref 78.0–100.0)
Monocytes Absolute: 0.5 10*3/uL (ref 0.1–1.0)
Neutrophils Relative %: 81 % — ABNORMAL HIGH (ref 43.0–77.0)
Platelets: 268 10*3/uL (ref 150.0–400.0)
RDW: 13 % (ref 11.5–14.6)
WBC: 13.2 10*3/uL — ABNORMAL HIGH (ref 4.5–10.5)

## 2012-07-19 LAB — HEPATIC FUNCTION PANEL
ALT: 20 U/L (ref 0–35)
Bilirubin, Direct: 0.1 mg/dL (ref 0.0–0.3)
Total Bilirubin: 0.7 mg/dL (ref 0.3–1.2)

## 2012-07-19 LAB — BASIC METABOLIC PANEL
BUN: 20 mg/dL (ref 6–23)
CO2: 29 mEq/L (ref 19–32)
Chloride: 100 mEq/L (ref 96–112)
Glucose, Bld: 123 mg/dL — ABNORMAL HIGH (ref 70–99)
Potassium: 3.8 mEq/L (ref 3.5–5.1)
Sodium: 137 mEq/L (ref 135–145)

## 2012-07-19 MED ORDER — ONDANSETRON 8 MG PO TBDP: 8.0000 mg | ORAL_TABLET | Freq: Three times a day (TID) | ORAL | Status: DC | PRN

## 2012-07-19 MED ORDER — AMPHETAMINE-DEXTROAMPHET ER 20 MG PO CP24: 20.0000 mg | ORAL_CAPSULE | ORAL | Status: DC

## 2012-07-19 NOTE — Telephone Encounter (Signed)
Pt needs new rx generic adderall xr 20 mg °

## 2012-07-19 NOTE — Patient Instructions (Addendum)
Nausea, Adult Nausea is the feeling that you have an upset stomach or have to vomit. Nausea by itself is not likely a serious concern, but it may be an early sign of more serious medical problems. As nausea gets worse, it can lead to vomiting. If vomiting develops, there is the risk of dehydration.  CAUSES   Viral infections.  Food poisoning.  Medicines.  Pregnancy.  Motion sickness.  Migraine headaches.  Emotional distress.  Severe pain from any source.  Alcohol intoxication. HOME CARE INSTRUCTIONS  Get plenty of rest.  Ask your caregiver about specific rehydration instructions.  Eat small amounts of food and sip liquids more often.  Take all medicines as told by your caregiver. SEEK MEDICAL CARE IF:  You have not improved after 2 days, or you get worse.  You have a headache. SEEK IMMEDIATE MEDICAL CARE IF:   You have a fever.  You faint.  You keep vomiting or have blood in your vomit.  You are extremely weak or dehydrated.  You have dark or bloody stools.  You have severe chest or abdominal pain. MAKE SURE YOU:  Understand these instructions.  Will watch your condition.  Will get help right away if you are not doing well or get worse. Document Released: 09/10/2004 Document Revised: 10/26/2011 Document Reviewed: 04/15/2011 ExitCare Patient Information 2013 [employer].  

## 2012-07-19 NOTE — Progress Notes (Signed)
Subjective:    Patient ID: Andrea Willis, female    DOB: [birth year], 36 y.o.   MRN: [id number]  HPI 36 year old white female, nonsmoker is in with complaints of fatigue and nausea x1-1/2 weeks. Denies any sneezing, cough, congestion, no abdominal pain, increase in belching or burping, diarrhea or constipation. Denies any ill contacts. Has not taken any medication for relief. No concerns of pregnancy.   Review of Systems  Constitutional: Positive for fatigue.  HENT: Negative.   Respiratory: Negative.   Cardiovascular: Negative.   Gastrointestinal: Positive for nausea. Negative for abdominal pain, diarrhea, constipation, abdominal distention and anal bleeding.  Genitourinary: Negative.  Negative for urgency and frequency.  Musculoskeletal: Negative.   Skin: Negative.   Neurological: Negative.   Hematological: Negative.   Psychiatric/Behavioral: Negative.    Past Medical History  Diagnosis Date  . Attention deficit hyperactivity disorder (ADHD)     History   Social History  . Marital Status: Married    Spouse Name: N/A    Number of Children: N/A  . Years of Education: N/A   Occupational History  . Not on file.   Social History Main Topics  . Smoking status: Never Smoker   . Smokeless tobacco: Not on file  . Alcohol Use: Yes  . Drug Use: No  . Sexually Active: Not on file   Other Topics Concern  . Not on file   Social History Narrative   ** Merged History Encounter **     No past surgical history on file.  No family history on file.  No Known Allergies  Current Outpatient Prescriptions on File Prior to Visit  Medication Sig Dispense Refill  . amphetamine-dextroamphetamine (ADDERALL XR) 20 MG 24 hr capsule Take 1 capsule (20 mg total) by mouth every morning.  30 capsule  0  . CRANBERRY PO Take 1 capsule by mouth daily.      Marland Kitchen CRANBERRY PO Take 2 tablets by mouth daily.      . fish oil-omega-3 fatty acids 1000 MG capsule Take 1 g by mouth daily.      Marland Kitchen  ibuprofen (ADVIL,MOTRIN) 200 MG tablet Take 800 mg by mouth every 6 (six) hours as needed. Patient used this medication for her headache.      . Multiple Vitamin (MULTIVITAMIN) tablet Take 1 tablet by mouth daily.      . Multiple Vitamins-Minerals (MULTIVITAMINS THER. W/MINERALS) TABS Take 1 tablet by mouth daily.      . Omega-3 Fatty Acids (FISH OIL PO) Take 1 capsule by mouth daily.      . [DISCONTINUED] amphetamine-dextroamphetamine (ADDERALL XR) 20 MG 24 hr capsule Take 1 capsule (20 mg total) by mouth every morning.  30 capsule  0    BP 122/68  Pulse 92  Temp 98.7 F (37.1 C) (Oral)  Wt 139 lb (63.05 kg)  SpO2 100%  LMP 11/19/2013chart    Objective:   Physical Exam  Constitutional: She is oriented to person, place, and time. She appears well-developed and well-nourished.  HENT:  Right Ear: External ear normal.  Left Ear: External ear normal.  Nose: Nose normal.  Mouth/Throat: Oropharynx is clear and moist.  Neck: Normal range of motion. Neck supple. No thyromegaly present.  Cardiovascular: Normal rate, regular rhythm and normal heart sounds.   Pulmonary/Chest: Effort normal and breath sounds normal.  Abdominal: Soft. Bowel sounds are normal.  Neurological: She is alert and oriented to person, place, and time.  Skin: Skin is warm and dry.  Psychiatric: She has a  normal mood and affect.      Urine pregnancy: Negative    Assessment & Plan:  Assessment: Nausea, fatigue  Plan: Lab sent to include CBC, LFTs, BMP, Monospot notify patient pending results. Zofran 8 mg ODT as needed for nausea. Call the office if symptoms worsen or persist. Recheck a schedule, and when necessary. Bland diet as tolerated.

## 2012-08-22 ENCOUNTER — Other Ambulatory Visit: Payer: Self-pay | Admitting: Family

## 2012-08-22 MED ORDER — AMPHETAMINE-DEXTROAMPHET ER 20 MG PO CP24: 20.0000 mg | ORAL_CAPSULE | ORAL | Status: DC

## 2012-08-22 NOTE — Telephone Encounter (Signed)
Pt would like refill of amphetamine-dextroamphetamine (ADDERALL XR) 20 MG 24 hr capsule. Thank you!

## 2012-08-22 NOTE — Telephone Encounter (Signed)
Rx ready for pick up and pt aware

## 2012-10-10 ENCOUNTER — Encounter: Payer: Self-pay | Admitting: Family

## 2012-10-10 ENCOUNTER — Ambulatory Visit (INDEPENDENT_AMBULATORY_CARE_PROVIDER_SITE_OTHER): Payer: BC Managed Care – PPO | Admitting: Family

## 2012-10-10 ENCOUNTER — Other Ambulatory Visit: Payer: Self-pay

## 2012-10-10 VITALS — BP 120/78 | HR 71 | Wt 139.0 lb

## 2012-10-10 DIAGNOSIS — F411 Generalized anxiety disorder: Secondary | ICD-10-CM

## 2012-10-10 MED ORDER — AMPHETAMINE-DEXTROAMPHET ER 20 MG PO CP24: 20.0000 mg | ORAL_CAPSULE | ORAL | Status: DC

## 2012-10-10 MED ORDER — BUPROPION HCL ER (XL) 150 MG PO TB24: 150.0000 mg | ORAL_TABLET | Freq: Every day | ORAL | Status: DC

## 2012-10-10 NOTE — Patient Instructions (Signed)
Anxiety and Panic Attacks Your caregiver has informed you that you are having an anxiety or panic attack. There may be many forms of this. Most of the time these attacks come suddenly and without warning. They come at any time of day, including periods of sleep, and at any time of life. They may be strong and unexplained. Although panic attacks are very scary, they are physically harmless. Sometimes the cause of your anxiety is not known. Anxiety is a protective mechanism of the body in its fight or flight mechanism. Most of these perceived danger situations are actually nonphysical situations (such as anxiety over losing a job). CAUSES  The causes of an anxiety or panic attack are many. Panic attacks may occur in otherwise healthy people given a certain set of circumstances. There may be a genetic cause for panic attacks. Some medications may also have anxiety as a side effect. SYMPTOMS  Some of the most common feelings are:  Intense terror.  Dizziness, feeling faint.  Hot and cold flashes.  Fear of going crazy.  Feelings that nothing is real.  Sweating.  Shaking.  Chest pain or a fast heartbeat (palpitations).  Smothering, choking sensations.  Feelings of impending doom and that death is near.  Tingling of extremities, this may be from over-breathing.  Altered reality (derealization).  Being detached from yourself (depersonalization). Several symptoms can be present to make up anxiety or panic attacks. DIAGNOSIS  The evaluation by your caregiver will depend on the type of symptoms you are experiencing. The diagnosis of anxiety or panic attack is made when no physical illness can be determined to be a cause of the symptoms. TREATMENT  Treatment to prevent anxiety and panic attacks may include:  Avoidance of circumstances that cause anxiety.  Reassurance and relaxation.  Regular exercise.  Relaxation therapies, such as yoga.  Psychotherapy with a psychiatrist or  therapist.  Avoidance of caffeine, alcohol and illegal drugs.  Prescribed medication. SEEK IMMEDIATE MEDICAL CARE IF:   You experience panic attack symptoms that are different than your usual symptoms.  You have any worsening or concerning symptoms. Document Released: 08/03/2005 Document Revised: 10/26/2011 Document Reviewed: 12/05/2009 [facility] Patient Information 2013 [place], [place].   Epidermal Cyst An epidermal cyst is sometimes called a sebaceous cyst, epidermal inclusion cyst, or infundibular cyst. These cysts usually contain a substance that looks "pasty" or "cheesy" and may have a bad smell. This substance is a protein called keratin. Epidermal cysts are usually found on the face, neck, or trunk. They may also occur in the vaginal area or other parts of the genitalia of both men and women. Epidermal cysts are usually small, painless, slow-growing bumps or lumps that move freely under the skin. It is important not to try to pop them. This may cause an infection and lead to tenderness and swelling. CAUSES  Epidermal cysts may be caused by a deep penetrating injury to the skin or a plugged hair follicle, often associated with acne. SYMPTOMS  Epidermal cysts can become inflamed and cause:  Redness.  Tenderness.  Increased temperature of the skin over the bumps or lumps.  Grayish-white, bad smelling material that drains from the bump or lump. DIAGNOSIS  Epidermal cysts are easily diagnosed by your caregiver during an exam. Rarely, a tissue sample (biopsy) may be taken to rule out other conditions that may resemble epidermal cysts. TREATMENT   Epidermal cysts often get better and disappear on their own. They are rarely ever cancerous.  If a cyst becomes infected, it  may become inflamed and tender. This may require opening and draining the cyst. Treatment with antibiotics may be necessary. When the infection is gone, the cyst may be removed with minor surgery.  Small, inflamed  cysts can often be treated with antibiotics or by injecting steroid medicines.  Sometimes, epidermal cysts become large and bothersome. If this happens, surgical removal in your caregiver's office may be necessary. HOME CARE INSTRUCTIONS  Only take over-the-counter or prescription medicines as directed by your caregiver.  Take your antibiotics as directed. Finish them even if you start to feel better. SEEK MEDICAL CARE IF:   Your cyst becomes tender, red, or swollen.  Your condition is not improving or is getting worse.  You have any other questions or concerns. MAKE SURE YOU:  Understand these instructions.  Will watch your condition.  Will get help right away if you are not doing well or get worse. Document Released: 07/04/2004 Document Revised: 10/26/2011 Document Reviewed: 02/09/2011 [facility] Patient Information 2013 [place], [place].

## 2012-10-10 NOTE — Progress Notes (Signed)
Subjective:    Patient ID: Andrea Willis, female    DOB: 06-29-1976, 37 y.o.   MRN: [id number]  HPI 37 year old white female, nonsmoker, is in today with complaints of increased irritability, short tempered anxiousness over the last several months. The patient reports always having a history of anxiety but never being treated. She's at a point now she is requesting treatment. Denies any difficulty sleeping or concentrating. Denies any depression or anxiety.  Patient also has concerns of a small mass on the left middle back has been present several months. It is flesh-colored and painless. Denies any changes.   Review of Systems  Constitutional: Negative.   Respiratory: Negative.   Cardiovascular: Negative.   Musculoskeletal: Negative.   Skin:       Small mass in the left middle back nontender  Neurological: Negative.   Hematological: Negative.   Psychiatric/Behavioral: Negative for confusion, sleep disturbance and self-injury. The patient is nervous/anxious.    Past Medical History  Diagnosis Date  . Attention deficit hyperactivity disorder (ADHD)     History   Social History  . Marital Status: Married    Spouse Name: N/A    Number of Children: N/A  . Years of Education: N/A   Occupational History  . Not on file.   Social History Main Topics  . Smoking status: Never Smoker   . Smokeless tobacco: Not on file  . Alcohol Use: Yes  . Drug Use: No  . Sexually Active: Not on file   Other Topics Concern  . Not on file   Social History Narrative   ** Merged History Encounter **        History reviewed. No pertinent past surgical history.  No family history on file.  No Known Allergies  Current Outpatient Prescriptions on File Prior to Visit  Medication Sig Dispense Refill  . CRANBERRY PO Take 1 capsule by mouth daily.      Marland Kitchen CRANBERRY PO Take 2 tablets by mouth daily.      . fish oil-omega-3 fatty acids 1000 MG capsule Take 1 g by mouth daily.      Marland Kitchen ibuprofen  (ADVIL,MOTRIN) 200 MG tablet Take 800 mg by mouth every 6 (six) hours as needed. Patient used this medication for her headache.      . Multiple Vitamin (MULTIVITAMIN) tablet Take 1 tablet by mouth daily.      . Multiple Vitamins-Minerals (MULTIVITAMINS THER. W/MINERALS) TABS Take 1 tablet by mouth daily.      . Omega-3 Fatty Acids (FISH OIL PO) Take 1 capsule by mouth daily.      . ondansetron (ZOFRAN ODT) 8 MG disintegrating tablet Take 1 tablet (8 mg total) by mouth every 8 (eight) hours as needed for nausea.  30 tablet  0   No current facility-administered medications on file prior to visit.    BP 120/78  Pulse 71  Wt 139 lb (63.05 kg)  BMI 24.63 kg/m2  SpO2 96%chart    Objective:   Physical Exam  Constitutional: She is oriented to person, place, and time. She appears well-developed and well-nourished.  Neck: Normal range of motion. Neck supple. No thyromegaly present.  Cardiovascular: Normal rate, regular rhythm and normal heart sounds.   Pulmonary/Chest: Effort normal and breath sounds normal.  Musculoskeletal: Normal range of motion.  Neurological: She is alert and oriented to person, place, and time. She has normal reflexes.  Skin: Skin is warm and dry.  Small sebaceous cyst to the left middle back. Small amount of  pustular discharge noted.   Psychiatric: She has a normal mood and affect.          Assessment & Plan:  Assessment: 1. Anxiety 2. Sebaceous cyst  Plan: Start Wellbutrin XL 150 mg once daily. Continue exercise daily. His sebaceous cyst is causing any problems at her next office visit we will consider an I&D. Patient advised to call the office if symptoms worsen or persist. Recheck in 3 weeks and sooner as needed.

## 2012-10-24 ENCOUNTER — Telehealth: Payer: Self-pay | Admitting: Family

## 2012-10-24 MED ORDER — CLONAZEPAM 0.5 MG PO TABS: 0.5000 mg | ORAL_TABLET | Freq: Two times a day (BID) | ORAL | Status: DC | PRN

## 2012-10-24 NOTE — Telephone Encounter (Signed)
Caller: Andrea Willis/Patient; Phone: [phone]; Reason for Call: Caller does not want to speak to Rn wants [name] to call her her back about the medicaion she has her on welburton dose not want to take it this morning.

## 2012-10-24 NOTE — Telephone Encounter (Signed)
May use klonopin .5mg  once a day.

## 2012-10-24 NOTE — Telephone Encounter (Signed)
Pt has concerns about taking Wellbutrin qd. She would like to have an anxiety med that she can take prn. She states that she is not depressed and only needs something for the times that she is on edge. She was also concerned because after having only one beer with her friends, she fell asleep. Pt requesting xanax or something similar. Please advise

## 2012-10-24 NOTE — Telephone Encounter (Signed)
Pt aware. Ok for pt to take prn, per [place]

## 2012-10-31 ENCOUNTER — Ambulatory Visit (INDEPENDENT_AMBULATORY_CARE_PROVIDER_SITE_OTHER): Payer: BC Managed Care – PPO | Admitting: Family

## 2012-10-31 ENCOUNTER — Encounter: Payer: Self-pay | Admitting: Family

## 2012-10-31 VITALS — BP 98/60 | HR 98 | Wt 139.0 lb

## 2012-10-31 DIAGNOSIS — F411 Generalized anxiety disorder: Secondary | ICD-10-CM

## 2012-10-31 DIAGNOSIS — F988 Other specified behavioral and emotional disorders with onset usually occurring in childhood and adolescence: Secondary | ICD-10-CM

## 2012-10-31 MED ORDER — AMPHETAMINE-DEXTROAMPHET ER 30 MG PO CP24: 30.0000 mg | ORAL_CAPSULE | ORAL | Status: DC

## 2012-10-31 NOTE — Patient Instructions (Addendum)
Attention Deficit Hyperactivity Disorder Attention deficit hyperactivity disorder (ADHD) is a problem with behavior issues based on the way the brain functions (neurobehavioral disorder). It is a common reason for behavior and academic problems in school. CAUSES  The cause of ADHD is unknown in most cases. It may run in families. It sometimes can be associated with learning disabilities and other behavioral problems. SYMPTOMS  There are 3 types of ADHD. The 3 types and some of the symptoms include:  Inattentive  Gets bored or distracted easily.  Loses or forgets things. Forgets to hand in homework.  Has trouble organizing or completing tasks.  Difficulty staying on task.  An inability to organize daily tasks and school work.  Leaving projects, chores, or homework unfinished.  Trouble paying attention or responding to details. Careless mistakes.  Difficulty following directions. Often seems like is not listening.  Dislikes activities that require sustained attention (like chores or homework).  Hyperactive-impulsive  Feels like it is impossible to sit still or stay in a seat. Fidgeting with hands and feet.  Trouble waiting turn.  Talking too much or out of turn. Interruptive.  Speaks or acts impulsively.  Aggressive, disruptive behavior.  Constantly busy or on the go, noisy.  Combined  Has symptoms of both of the above. Often children with ADHD feel discouraged about themselves and with school. They often perform well below their abilities in school. These symptoms can cause problems in home, school, and in relationships with peers. As children get older, the excess motor activities can calm down, but the problems with paying attention and staying organized persist. Most children do not outgrow ADHD but with good treatment can learn to cope with the symptoms. DIAGNOSIS  When ADHD is suspected, the diagnosis should be made by professionals trained in ADHD.  Diagnosis will  include:  Ruling out other reasons for the child's behavior.  The caregivers will check with the child's school and check their medical records.  They will talk to teachers and parents.  Behavior rating scales for the child will be filled out by those dealing with the child on a daily basis. A diagnosis is made only after all information has been considered. TREATMENT  Treatment usually includes behavioral treatment often along with medicines. It may include stimulant medicines. The stimulant medicines decrease impulsivity and hyperactivity and increase attention. Other medicines used include antidepressants and certain blood pressure medicines. Most experts agree that treatment for ADHD should address all aspects of the child's functioning. Treatment should not be limited to the use of medicines alone. Treatment should include structured classroom management. The parents must receive education to address rewarding good behavior, discipline, and limit-setting. Tutoring or behavioral therapy or both should be available for the child. If untreated, the disorder can have long-term serious effects into adolescence and adulthood. HOME CARE INSTRUCTIONS   Often with ADHD there is a lot of frustration among the family in dealing with the illness. There is often blame and anger that is not warranted. This is a life long illness. There is no way to prevent ADHD. In many cases, because the problem affects the family as a whole, the entire family may need help. A therapist can help the family find better ways to handle the disruptive behaviors and promote change. If the child is young, most of the therapist's work is with the parents. Parents will learn techniques for coping with and improving their child's behavior. Sometimes only the child with the ADHD needs counseling. Your caregivers can help   you make these decisions.  Children with ADHD may need help in organizing. Some helpful tips include:  Keep  routines the same every day from wake-up time to bedtime. Schedule everything. This includes homework and playtime. This should include outdoor and indoor recreation. Keep the schedule on the refrigerator or a bulletin board where it is frequently seen. Mark schedule changes as far in advance as possible.  Have a place for everything and keep everything in its place. This includes clothing, backpacks, and school supplies.  Encourage writing down assignments and bringing home needed books.  Offer your child a well-balanced diet. Breakfast is especially important for school performance. Children should avoid drinks with caffeine including:  Soft drinks.  Coffee.  Tea.  However, some older children (adolescents) may find these drinks helpful in improving their attention.  Children with ADHD need consistent rules that they can understand and follow. If rules are followed, give small rewards. Children with ADHD often receive, and expect, criticism. Look for good behavior and praise it. Set realistic goals. Give clear instructions. Look for activities that can foster success and self-esteem. Make time for pleasant activities with your child. Give lots of affection.  Parents are their children's greatest advocates. Learn as much as possible about ADHD. This helps you become a stronger and better advocate for your child. It also helps you educate your child's teachers and instructors if they feel inadequate in these areas. Parent support groups are often helpful. A national group with local chapters is called CHADD (Children and Adults with Attention Deficit Hyperactivity Disorder). PROGNOSIS  There is no cure for ADHD. Children with the disorder seldom outgrow it. Many find adaptive ways to accommodate the ADHD as they mature. SEEK MEDICAL CARE IF:  Your child has repeated muscle twitches, cough or speech outbursts.  Your child has sleep problems.  Your child has a marked loss of  appetite.  Your child develops depression.  Your child has new or worsening behavioral problems.  Your child develops dizziness.  Your child has a racing heart.  Your child has stomach pains.  Your child develops headaches. Document Released: 07/24/2002 Document Revised: 10/26/2011 Document Reviewed: 03/05/2008 [employer] Patient Information 2013 [employer].  

## 2012-10-31 NOTE — Progress Notes (Signed)
Subjective:    Patient ID: Andrea Willis, female    DOB: [birth year], 37 y.o.   MRN: [id number]  HPI 21 year olf WF, nonsmoker is in today with for a recheck of ADD. She has been on Adderall ion is not working as XR 20mg  once a day. She feels like the medication is not working as effectively. Has difficulty concentrating at times. Would like to consider an increase in the medication.   Takes Klonopin as needed for anxiety at bedtime. Has only taken 2 doses in 3 week and believes it is working well.  Review of Systems  Constitutional: Negative.   Respiratory: Negative.   Cardiovascular: Negative.   Gastrointestinal: Negative.   Endocrine: Negative.   Genitourinary: Negative.   Musculoskeletal: Negative.   Skin: Negative.   Neurological: Negative.   Hematological: Negative.    Past Medical History  Diagnosis Date  . Attention deficit hyperactivity disorder (ADHD)     History   Social History  . Marital Status: Married    Spouse Name: N/A    Number of Children: N/A  . Years of Education: N/A   Occupational History  . Not on file.   Social History Main Topics  . Smoking status: Never Smoker   . Smokeless tobacco: Not on file  . Alcohol Use: Yes  . Drug Use: No  . Sexually Active: Not on file   Other Topics Concern  . Not on file   Social History Narrative   ** Merged History Encounter **        History reviewed. No pertinent past surgical history.  No family history on file.  No Known Allergies  Current Outpatient Prescriptions on File Prior to Visit  Medication Sig Dispense Refill  . clonazePAM (KLONOPIN) 0.5 MG tablet Take 1 tablet (0.5 mg total) by mouth 2 (two) times daily as needed for anxiety.  60 tablet  0  . CRANBERRY PO Take 1 capsule by mouth daily.      Marland Kitchen CRANBERRY PO Take 2 tablets by mouth daily.      . fish oil-omega-3 fatty acids 1000 MG capsule Take 1 g by mouth daily.      Marland Kitchen ibuprofen (ADVIL,MOTRIN) 200 MG tablet Take 800 mg by mouth every  6 (six) hours as needed. Patient used this medication for her headache.      . Multiple Vitamin (MULTIVITAMIN) tablet Take 1 tablet by mouth daily.      . Multiple Vitamins-Minerals (MULTIVITAMINS THER. W/MINERALS) TABS Take 1 tablet by mouth daily.      . Omega-3 Fatty Acids (FISH OIL PO) Take 1 capsule by mouth daily.      . ondansetron (ZOFRAN ODT) 8 MG disintegrating tablet Take 1 tablet (8 mg total) by mouth every 8 (eight) hours as needed for nausea.  30 tablet  0   No current facility-administered medications on file prior to visit.    BP 98/60  Pulse 98  Wt 139 lb (63.05 kg)  BMI 24.63 kg/m2  SpO2 99%chart    Objective:   Physical Exam  Constitutional: She is oriented to person, place, and time.  HENT:  Right Ear: External ear normal.  Left Ear: External ear normal.  Nose: Nose normal.  Mouth/Throat: Oropharynx is clear and moist.  Neck: Normal range of motion. Neck supple.  Cardiovascular: Normal rate, regular rhythm and normal heart sounds.   Pulmonary/Chest: Effort normal and breath sounds normal.  Abdominal: Soft. Bowel sounds are normal.  Neurological: She is alert and oriented  to person, place, and time.  Skin: Skin is warm and dry.  Psychiatric: She has a normal mood and affect.          Assessment & Plan:  Assessment:  1. ADD-uncontrolled 2. Anxiety  Plan: Increase Adderall XR 30mg  once a day. Recheck in 3 weeks and sooner as needed. Continue as needed Klonopin.

## 2012-12-30 ENCOUNTER — Telehealth: Payer: Self-pay | Admitting: Family

## 2012-12-30 MED ORDER — AMPHETAMINE-DEXTROAMPHET ER 30 MG PO CP24: 30.0000 mg | ORAL_CAPSULE | ORAL | Status: DC

## 2012-12-30 NOTE — Telephone Encounter (Signed)
Rx printed

## 2012-12-30 NOTE — Telephone Encounter (Signed)
Pt needs new rx generic adderall xr 30 mg °

## 2013-02-03 ENCOUNTER — Telehealth: Payer: Self-pay | Admitting: Family

## 2013-02-03 MED ORDER — AMPHETAMINE-DEXTROAMPHET ER 30 MG PO CP24: 30.0000 mg | ORAL_CAPSULE | ORAL | Status: DC

## 2013-02-03 NOTE — Telephone Encounter (Signed)
Ok to fill for one month, per [place]. Pt must schedule 3 month follow up.  Pt aware

## 2013-02-03 NOTE — Telephone Encounter (Signed)
PT called to request a 3 month refill of her amphetamine-dextroamphetamine (ADDERALL XR) 30 MG 24 hr capsule. She stated that she will be leaving town early Monday morning. Please assist.

## 2013-03-06 ENCOUNTER — Telehealth: Payer: Self-pay | Admitting: Family

## 2013-03-06 NOTE — Telephone Encounter (Signed)
Pt requesting new rx on her amphetamine-dextroamphetamine (ADDERALL XR) 30 MG 24 hr capsule Pt wants to know if it is possible to get more than (1) 30-day rx. Please advise & call when ready for pick up.

## 2013-03-06 NOTE — Telephone Encounter (Signed)
ok 

## 2013-03-06 NOTE — Telephone Encounter (Signed)
Patient is aware that she will need an office visit for refills.  However, she is going out of town Thursday morning.  Okay to work in?

## 2013-03-06 NOTE — Telephone Encounter (Signed)
Needs OV.  

## 2013-03-07 NOTE — Telephone Encounter (Signed)
appt made/kh 

## 2013-03-08 ENCOUNTER — Ambulatory Visit (INDEPENDENT_AMBULATORY_CARE_PROVIDER_SITE_OTHER): Payer: BC Managed Care – PPO | Admitting: Family

## 2013-03-08 ENCOUNTER — Encounter: Payer: Self-pay | Admitting: Family

## 2013-03-08 VITALS — HR 77 | Wt 141.0 lb

## 2013-03-08 DIAGNOSIS — F988 Other specified behavioral and emotional disorders with onset usually occurring in childhood and adolescence: Secondary | ICD-10-CM

## 2013-03-08 DIAGNOSIS — B079 Viral wart, unspecified: Secondary | ICD-10-CM

## 2013-03-08 MED ORDER — AMPHETAMINE-DEXTROAMPHET ER 30 MG PO CP24: 30.0000 mg | ORAL_CAPSULE | ORAL | Status: DC

## 2013-03-08 NOTE — Progress Notes (Signed)
Subjective:    Patient ID: Andrea Willis, female    DOB: 1976/06/14, 37 y.o.   MRN: [id number]  HPI  Pt is a 37 year old white female who presents to PCP for medication refill. Reports no adverse side effects to medications and feel as though treatment of ADD is currently being effectively managed by meds. Denies any acute issues at this time.   Review of Systems  Constitutional: Negative.   HENT: Negative.   Eyes: Negative.   Respiratory: Negative.   Cardiovascular: Negative.   Gastrointestinal: Negative.   Endocrine: Negative.   Genitourinary: Negative.   Musculoskeletal: Negative.   Skin:       Bump to R ring finger at nail cuticle  Allergic/Immunologic: Negative.   Neurological: Negative.   Hematological: Negative.   Psychiatric/Behavioral: Negative.    Past Medical History  Diagnosis Date  . Attention deficit hyperactivity disorder (ADHD)     History   Social History  . Marital Status: Married    Spouse Name: N/A    Number of Children: N/A  . Years of Education: N/A   Occupational History  . Not on file.   Social History Main Topics  . Smoking status: Never Smoker   . Smokeless tobacco: Not on file  . Alcohol Use: Yes  . Drug Use: No  . Sexually Active: Not on file   Other Topics Concern  . Not on file   Social History Narrative   ** Merged History Encounter **        History reviewed. No pertinent past surgical history.  No family history on file.  No Known Allergies  Current Outpatient Prescriptions on File Prior to Visit  Medication Sig Dispense Refill  . clonazePAM (KLONOPIN) 0.5 MG tablet Take 1 tablet (0.5 mg total) by mouth 2 (two) times daily as needed for anxiety.  60 tablet  0  . CRANBERRY PO Take 1 capsule by mouth daily.      Marland Kitchen CRANBERRY PO Take 2 tablets by mouth daily.      . fish oil-omega-3 fatty acids 1000 MG capsule Take 1 g by mouth daily.      Marland Kitchen ibuprofen (ADVIL,MOTRIN) 200 MG tablet Take 800 mg by mouth every 6 (six)  hours as needed. Patient used this medication for her headache.      . Multiple Vitamin (MULTIVITAMIN) tablet Take 1 tablet by mouth daily.      . Multiple Vitamins-Minerals (MULTIVITAMINS THER. W/MINERALS) TABS Take 1 tablet by mouth daily.      . Omega-3 Fatty Acids (FISH OIL PO) Take 1 capsule by mouth daily.      . ondansetron (ZOFRAN ODT) 8 MG disintegrating tablet Take 1 tablet (8 mg total) by mouth every 8 (eight) hours as needed for nausea.  30 tablet  0   No current facility-administered medications on file prior to visit.    Pulse 77  Wt 141 lb (63.957 kg)  BMI 24.98 kg/m2  SpO2 98%chart    Objective:   Physical Exam  Constitutional: She is oriented to person, place, and time. She appears well-developed and well-nourished.  HENT:  Head: Normocephalic and atraumatic.  Eyes: Conjunctivae are normal. Pupils are equal, round, and reactive to light.  Neck: Normal range of motion.  Cardiovascular: Normal rate and regular rhythm.   Pulmonary/Chest: Effort normal and breath sounds normal.  Abdominal: Soft. Bowel sounds are normal.  Musculoskeletal: Normal range of motion.  Neurological: She is alert and oriented to person, place, and time.  Skin: Skin is warm and dry.  <1cm wart appearing lesion to 4th digit of R nail cuticle  Psychiatric: She has a normal mood and affect.     Informed consent was obtained and the site was treated with 20 % acetic acid. The color of the lesion changed to white with the application of acid. The patient tolerated the procedure well and  aftercare instructions were given to the patient.     Assessment & Plan:  1. ADD 2. Viral Wart   Pt medications refilled. Instructed follow up with PCP in 3 months for med check or sooner with any acute issues, questions or concerns.  Note by Davonna Belling, FNP Student

## 2013-06-06 ENCOUNTER — Other Ambulatory Visit (INDEPENDENT_AMBULATORY_CARE_PROVIDER_SITE_OTHER): Payer: BC Managed Care – PPO

## 2013-06-06 DIAGNOSIS — Z0000 Encounter for general adult medical examination without abnormal findings: Secondary | ICD-10-CM

## 2013-06-06 LAB — LIPID PANEL
Cholesterol: 183 mg/dL (ref 0–200)
LDL Cholesterol: 77 mg/dL (ref 0–99)
Triglycerides: 37 mg/dL (ref 0.0–149.0)

## 2013-06-06 LAB — HEPATIC FUNCTION PANEL
Albumin: 4.1 g/dL (ref 3.5–5.2)
Alkaline Phosphatase: 54 U/L (ref 39–117)
Bilirubin, Direct: 0.1 mg/dL (ref 0.0–0.3)
Total Protein: 6.2 g/dL (ref 6.0–8.3)

## 2013-06-06 LAB — CBC WITH DIFFERENTIAL/PLATELET
Basophils Absolute: 0 10*3/uL (ref 0.0–0.1)
Basophils Relative: 0.4 % (ref 0.0–3.0)
Eosinophils Absolute: 0.1 10*3/uL (ref 0.0–0.7)
Lymphocytes Relative: 30 % (ref 12.0–46.0)
MCHC: 34.1 g/dL (ref 30.0–36.0)
MCV: 93.7 fl (ref 78.0–100.0)
Monocytes Absolute: 0.5 10*3/uL (ref 0.1–1.0)
Neutrophils Relative %: 60.8 % (ref 43.0–77.0)
RDW: 13.5 % (ref 11.5–14.6)

## 2013-06-06 LAB — BASIC METABOLIC PANEL
BUN: 19 mg/dL (ref 6–23)
CO2: 26 mEq/L (ref 19–32)
Calcium: 9.2 mg/dL (ref 8.4–10.5)
Chloride: 105 mEq/L (ref 96–112)
Creatinine, Ser: 0.8 mg/dL (ref 0.4–1.2)
Glucose, Bld: 84 mg/dL (ref 70–99)

## 2013-06-06 LAB — POCT URINALYSIS DIPSTICK
Glucose, UA: NEGATIVE
Ketones, UA: NEGATIVE
Protein, UA: NEGATIVE
Spec Grav, UA: 1.015
Urobilinogen, UA: 0.2

## 2013-06-19 ENCOUNTER — Encounter: Payer: BC Managed Care – PPO | Admitting: Family

## 2013-06-27 ENCOUNTER — Encounter: Payer: Self-pay | Admitting: Family

## 2013-06-27 ENCOUNTER — Ambulatory Visit (INDEPENDENT_AMBULATORY_CARE_PROVIDER_SITE_OTHER): Payer: BC Managed Care – PPO | Admitting: Family

## 2013-06-27 VITALS — BP 98/60 | HR 81 | Ht 63.0 in | Wt 147.0 lb

## 2013-06-27 DIAGNOSIS — F411 Generalized anxiety disorder: Secondary | ICD-10-CM

## 2013-06-27 DIAGNOSIS — Z0000 Encounter for general adult medical examination without abnormal findings: Secondary | ICD-10-CM

## 2013-06-27 DIAGNOSIS — F988 Other specified behavioral and emotional disorders with onset usually occurring in childhood and adolescence: Secondary | ICD-10-CM

## 2013-06-27 MED ORDER — AMPHETAMINE-DEXTROAMPHET ER 30 MG PO CP24: 30.0000 mg | ORAL_CAPSULE | ORAL | Status: DC

## 2013-06-27 NOTE — Progress Notes (Signed)
Pre-visit discussion using our clinic review tool. No additional management support is needed unless otherwise documented below in the visit note.  

## 2013-06-27 NOTE — Patient Instructions (Signed)
Vitamin B12 oral What is this medicine? CYANOCOBALAMIN (sye an oh koe BAL a min) is a man made form of vitamin B12. Vitamin B12 is essential in the development of healthy blood cells, nerve cells, and proteins in the body. It also helps with the metabolism of fats and carbohydrates. It is added to a healthy diet to prevent or treat low vitamin B-12 levels. This medicine may be used for other purposes; ask your health care provider or pharmacist if you have questions. What should I tell my health care provider before I take this medicine? They need to know if you have any of these conditions: -anemia -kidney disease -Leber's disease -malabsorption disorder -an unusual or allergic reaction to cyanocobalamin, cobalt, other medicines, foods, dyes, or preservatives -pregnant or trying to get pregnant -breast-feeding How should I use this medicine? Take this medicine by mouth with a glass of water. Follow the directions on the package or prescription label. If you are taking the tablets, do not chew, cut, or crush this medicine. If using an vitamin solution, use a specially marked spoon or dropper to measure each dose. Ask your pharmacist if you do not have one. Household spoons are not accurate. For best results take this vitamin with food. Take your medicine at regular intervals. Do not take your medicine more often than directed. Talk to your pediatrician regarding the use of this medicine in children. While this drug may be prescribed for selected conditions, precautions do apply. Overdosage: If you think you have taken too much of this medicine contact a poison control center or emergency room at once. NOTE: This medicine is only for you. Do not share this medicine with others. What if I miss a dose? If you miss a dose, take it as soon as you can. If it is almost time for your next dose, take only that dose. Do not take double or extra doses. What may interact with this  medicine? -alcohol -aminosalicylic acid -colchicine -medicines that suppress your bone marrow like chemotherapy, chloramphenicol This list may not describe all possible interactions. Give your health care provider a list of all the medicines, herbs, non-prescription drugs, or dietary supplements you use. Also tell them if you smoke, drink alcohol, or use illegal drugs. Some items may interact with your medicine. What should I watch for while using this medicine? Follow a healthy diet. Taking a vitamin supplement does not replace the need for a balanced diet. Some foods that have vitamin B-12 naturally are fish, seafood, egg yolk, milk and fermented cheese. Too much of this vitamin can be unsafe. Talk to your doctor or health care provider about how much is right for you. What side effects may I notice from receiving this medicine? Side effects that you should report to your doctor or health care professional as soon as possible: -allergic reactions like skin rash, itching or hives, swelling of the face, lips, or tongue -breathing problems -chest pain, tightness Side effects that usually do not require medical attention (report to your doctor or health care professional if they continue or are bothersome): -diarrhea This list may not describe all possible side effects. Call your doctor for medical advice about side effects. You may report side effects to FDA at 1-800-FDA-1088. Where should I keep my medicine? Keep out of the reach of children. Store at room temperature between 15 and 30 degrees C (59 and 85 degrees F). Protect from heat and light. Throw away any unused medicine after the expiration date. NOTE: This   sheet is a summary. It may not cover all possible information. If you have questions about this medicine, talk to your doctor, pharmacist, or health care provider.  2014, Elsevier/Gold Standard. (2012-01-05 07:58:17)

## 2013-06-28 ENCOUNTER — Encounter: Payer: Self-pay | Admitting: Family

## 2013-06-28 NOTE — Progress Notes (Signed)
Subjective:    Patient ID: Andrea Willis, female    DOB: 18-Jan-1976, 37 y.o.   MRN: [id number]  HPI 73 are all white female, nonsmoker is in for complete physical exam. Has concerns of feeling fatigued. Reports she sleeps about 6-8 hours nightly. Exercises daily. Denies any increased stress in her life. All labs are within normal limits. She has a history of attention deficit disorder and anxiety that is currently stable.  Review of Systems  Constitutional: Negative.   HENT: Negative.   Eyes: Negative.   Respiratory: Negative.   Cardiovascular: Negative.   Gastrointestinal: Negative.   Endocrine: Negative.   Genitourinary: Negative.   Musculoskeletal: Negative.   Skin: Negative.   Allergic/Immunologic: Negative.   Neurological: Negative.   Psychiatric/Behavioral: Negative.    Past Medical History  Diagnosis Date  . Attention deficit hyperactivity disorder (ADHD)     History   Social History  . Marital Status: Married    Spouse Name: N/A    Number of Children: N/A  . Years of Education: N/A   Occupational History  . Not on file.   Social History Main Topics  . Smoking status: Never Smoker   . Smokeless tobacco: Not on file  . Alcohol Use: Yes  . Drug Use: No  . Sexual Activity: Not on file   Other Topics Concern  . Not on file   Social History Narrative   ** Merged History Encounter **        History reviewed. No pertinent past surgical history.  No family history on file.  No Known Allergies  Current Outpatient Prescriptions on File Prior to Visit  Medication Sig Dispense Refill  . clonazePAM (KLONOPIN) 0.5 MG tablet Take 1 tablet (0.5 mg total) by mouth 2 (two) times daily as needed for anxiety.  60 tablet  0  . CRANBERRY PO Take 1 capsule by mouth daily.      Marland Kitchen CRANBERRY PO Take 2 tablets by mouth daily.      . fish oil-omega-3 fatty acids 1000 MG capsule Take 1 g by mouth daily.      Marland Kitchen ibuprofen (ADVIL,MOTRIN) 200 MG tablet Take 800 mg by mouth  every 6 (six) hours as needed. Patient used this medication for her headache.      . Multiple Vitamin (MULTIVITAMIN) tablet Take 1 tablet by mouth daily.      . Multiple Vitamins-Minerals (MULTIVITAMINS THER. W/MINERALS) TABS Take 1 tablet by mouth daily.      . Omega-3 Fatty Acids (FISH OIL PO) Take 1 capsule by mouth daily.      . ondansetron (ZOFRAN ODT) 8 MG disintegrating tablet Take 1 tablet (8 mg total) by mouth every 8 (eight) hours as needed for nausea.  30 tablet  0   No current facility-administered medications on file prior to visit.    BP 98/60  Pulse 81  Ht 5\' 3"  (1.6 m)  Wt 147 lb (66.679 kg)  BMI 26.05 kg/m2chart    Objective:   Physical Exam  Constitutional: She is oriented to person, place, and time. She appears well-developed and well-nourished.  HENT:  Head: Normocephalic.  Right Ear: External ear normal.  Left Ear: External ear normal.  Nose: Nose normal.  Mouth/Throat: Oropharynx is clear and moist.  Eyes: Conjunctivae and EOM are normal. Pupils are equal, round, and reactive to light.  Neck: Normal range of motion. Neck supple. No thyromegaly present.  Cardiovascular: Normal rate, regular rhythm and normal heart sounds.   Pulmonary/Chest: Effort normal and  breath sounds normal.  Abdominal: Soft. Bowel sounds are normal. She exhibits no distension. There is no tenderness. There is no rebound.  Musculoskeletal: Normal range of motion.  Neurological: She is alert and oriented to person, place, and time. She has normal reflexes. No cranial nerve deficit. Coordination normal.  Skin: Skin is warm and dry.  Psychiatric: She has a normal mood and affect.          Assessment & Plan:  Assessment: 1. Complete physical exam 2. Attention deficit disorder 3. Generalized anxiety disorder  Plan: Continue current medications. Continue healthy diet and exercise. Initiate B12 over-the-counter supplement to help with energy. Followup with patient in 3 months and sooner  if needed. Monthly self breast exams. See gynecology as scheduled.

## 2013-10-13 ENCOUNTER — Other Ambulatory Visit (INDEPENDENT_AMBULATORY_CARE_PROVIDER_SITE_OTHER): Payer: BC Managed Care – PPO

## 2013-10-13 ENCOUNTER — Other Ambulatory Visit: Payer: Self-pay

## 2013-10-13 ENCOUNTER — Telehealth: Payer: Self-pay | Admitting: Family

## 2013-10-13 ENCOUNTER — Other Ambulatory Visit: Payer: Self-pay | Admitting: Family

## 2013-10-13 ENCOUNTER — Telehealth: Payer: Self-pay

## 2013-10-13 DIAGNOSIS — R300 Dysuria: Secondary | ICD-10-CM

## 2013-10-13 LAB — POCT URINALYSIS DIPSTICK
Bilirubin, UA: NEGATIVE
Blood, UA: NEGATIVE
NITRITE UA: POSITIVE
PH UA: 5
Spec Grav, UA: 1.01
UROBILINOGEN UA: 4

## 2013-10-13 MED ORDER — FLUCONAZOLE 150 MG PO TABS: 150.0000 mg | ORAL_TABLET | Freq: Once | ORAL | Status: DC

## 2013-10-13 MED ORDER — SULFAMETHOXAZOLE-TMP DS 800-160 MG PO TABS: 1.0000 | ORAL_TABLET | Freq: Two times a day (BID) | ORAL | Status: DC

## 2013-10-13 NOTE — Telephone Encounter (Signed)
Pt is requesting to speak with nurse, pt states she has a uti and need a rx, and also an rx for a yeast infection as well. Pt declined appointment.

## 2013-10-13 NOTE — Telephone Encounter (Signed)
Pt states that she forgot to inform Padonda that her pharmacy has changed to [facility] [address], pt had her urine specimen today.

## 2013-10-13 NOTE — Telephone Encounter (Signed)
abx sent to [facility]

## 2013-10-13 NOTE — Telephone Encounter (Signed)
Left detailed message on personally identified voicemail to advise pt of Padonda's note.

## 2013-10-13 NOTE — Telephone Encounter (Signed)
Need to bring in a urine to the lab

## 2013-10-13 NOTE — Telephone Encounter (Signed)
Diflucan sent

## 2013-10-14 LAB — URINALYSIS, MICROSCOPIC ONLY
Bacteria, UA: NONE SEEN
Casts: NONE SEEN
Crystals: NONE SEEN
Squamous Epithelial / LPF: NONE SEEN

## 2013-10-16 ENCOUNTER — Telehealth: Payer: Self-pay | Admitting: Family

## 2013-10-16 ENCOUNTER — Other Ambulatory Visit (INDEPENDENT_AMBULATORY_CARE_PROVIDER_SITE_OTHER): Payer: BC Managed Care – PPO

## 2013-10-16 DIAGNOSIS — R7309 Other abnormal glucose: Secondary | ICD-10-CM

## 2013-10-16 LAB — HEMOGLOBIN A1C: HEMOGLOBIN A1C: 5.2 % (ref 4.6–6.5)

## 2013-10-16 NOTE — Telephone Encounter (Signed)
Patient had a lab appointment today and she also wanted to advised [place] that her pharmacy has changed. [facility] is [facility] using [facility] [address]. Thanks.

## 2013-10-16 NOTE — Telephone Encounter (Signed)
Pharmacy has been changed

## 2013-10-17 LAB — URINE CULTURE

## 2013-10-20 ENCOUNTER — Ambulatory Visit: Payer: BC Managed Care – PPO | Admitting: Family

## 2013-10-20 ENCOUNTER — Encounter: Payer: Self-pay | Admitting: *Deleted

## 2013-10-23 ENCOUNTER — Ambulatory Visit (INDEPENDENT_AMBULATORY_CARE_PROVIDER_SITE_OTHER): Payer: BC Managed Care – PPO | Admitting: Family

## 2013-10-23 ENCOUNTER — Encounter: Payer: Self-pay | Admitting: Family

## 2013-10-23 DIAGNOSIS — L239 Allergic contact dermatitis, unspecified cause: Secondary | ICD-10-CM

## 2013-10-23 DIAGNOSIS — F988 Other specified behavioral and emotional disorders with onset usually occurring in childhood and adolescence: Secondary | ICD-10-CM

## 2013-10-23 DIAGNOSIS — L259 Unspecified contact dermatitis, unspecified cause: Secondary | ICD-10-CM

## 2013-10-23 DIAGNOSIS — F411 Generalized anxiety disorder: Secondary | ICD-10-CM

## 2013-10-23 MED ORDER — AMPHETAMINE-DEXTROAMPHET ER 30 MG PO CP24: 30.0000 mg | ORAL_CAPSULE | ORAL | Status: DC

## 2013-10-23 MED ORDER — CLONAZEPAM 0.5 MG PO TABS
0.5000 mg | ORAL_TABLET | Freq: Two times a day (BID) | ORAL | Status: DC | PRN
Start: 2013-10-23 — End: 2014-03-01

## 2013-10-23 MED ORDER — METHYLPREDNISOLONE ACETATE 40 MG/ML IJ SUSP
80.0000 mg | Freq: Once | INTRAMUSCULAR | Status: AC
  Administered 2013-10-23: 80 mg via INTRAMUSCULAR

## 2013-10-23 NOTE — Patient Instructions (Signed)
Contact Dermatitis Contact dermatitis is a rash that happens when something touches the skin. You touched something that irritates your skin, or you have allergies to something you touched. HOME CARE   Avoid the thing that caused your rash.  Keep your rash away from hot water, soap, sunlight, chemicals, and other things that might bother it.  Do not scratch your rash.  You can take cool baths to help stop itching.  Only take medicine as told by your doctor.  Keep all doctor visits as told. GET HELP RIGHT AWAY IF:   Your rash is not better after 3 days.  Your rash gets worse.  Your rash is puffy (swollen), tender, red, sore, or warm.  You have problems with your medicine. MAKE SURE YOU:   Understand these instructions.  Will watch your condition.  Will get help right away if you are not doing well or get worse. Document Released: 05/31/2009 Document Revised: 10/26/2011 Document Reviewed: 01/06/2011 Exit[employer] Patient Information 2014 [employer].  

## 2013-10-23 NOTE — Progress Notes (Signed)
Pre visit review using our clinic review tool, if applicable. No additional management support is needed unless otherwise documented below in the visit note. 

## 2013-10-23 NOTE — Progress Notes (Signed)
Subjective:    Patient ID: Andrea Willis, female    DOB: 1976/05/12, 38 y.o.   MRN: [id number]  HPI 38 year old white female, nonsmoker then today with concerns of a rash x1 day it is mildly itchy. Denies any changes in detergents, soaps, lotions. Is unsure if she ingested any new foods. No one in the home has a rash. She also has a history of attention deficit disorder and anxiety and would like refills on Klonopin and Adderall. Tolerating medications well.   Review of Systems  Constitutional: Negative.   HENT: Negative.   Respiratory: Negative.   Cardiovascular: Negative.   Gastrointestinal: Negative.   Genitourinary: Negative.   Musculoskeletal: Negative.   Allergic/Immunologic: Negative.   Psychiatric/Behavioral: Negative.    Past Medical History  Diagnosis Date  . Attention deficit hyperactivity disorder (ADHD)     History   Social History  . Marital Status: Married    Spouse Name: N/A    Number of Children: N/A  . Years of Education: N/A   Occupational History  . Not on file.   Social History Main Topics  . Smoking status: Never Smoker   . Smokeless tobacco: Not on file  . Alcohol Use: Yes  . Drug Use: No  . Sexual Activity: Not on file   Other Topics Concern  . Not on file   Social History Narrative   ** Merged History Encounter **        No past surgical history on file.  No family history on file.  No Known Allergies  Current Outpatient Prescriptions on File Prior to Visit  Medication Sig Dispense Refill  . CRANBERRY PO Take 1 capsule by mouth daily.      Marland Kitchen. CRANBERRY PO Take 2 tablets by mouth daily.      . fish oil-omega-3 fatty acids 1000 MG capsule Take 1 g by mouth daily.      . fluconazole (DIFLUCAN) 150 MG tablet Take 1 tablet (150 mg total) by mouth once.  1 tablet  1  . ibuprofen (ADVIL,MOTRIN) 200 MG tablet Take 800 mg by mouth every 6 (six) hours as needed. Patient used this medication for her headache.      . loratadine (CLARITIN)  10 MG tablet Take 10 mg by mouth daily.      . Multiple Vitamin (MULTIVITAMIN) tablet Take 1 tablet by mouth daily.      . Multiple Vitamins-Minerals (MULTIVITAMINS THER. W/MINERALS) TABS Take 1 tablet by mouth daily.      . Omega-3 Fatty Acids (FISH OIL PO) Take 1 capsule by mouth daily.      . ondansetron (ZOFRAN ODT) 8 MG disintegrating tablet Take 1 tablet (8 mg total) by mouth every 8 (eight) hours as needed for nausea.  30 tablet  0  . sulfamethoxazole-trimethoprim (BACTRIM DS) 800-160 MG per tablet Take 1 tablet by mouth 2 (two) times daily.  14 tablet  0   No current facility-administered medications on file prior to visit.    There were no vitals taken for this visit.chart    Objective:   Physical Exam  Constitutional: She is oriented to person, place, and time. She appears well-developed and well-nourished.  HENT:  Right Ear: External ear normal.  Left Ear: External ear normal.  Nose: Nose normal.  Mouth/Throat: Oropharynx is clear and moist.  Neck: Normal range of motion. Neck supple.  Cardiovascular: Normal rate, regular rhythm and normal heart sounds.   Pulmonary/Chest: Effort normal and breath sounds normal.  Abdominal: Soft. Bowel  sounds are normal.  Musculoskeletal: Normal range of motion.  Neurological: She is alert and oriented to person, place, and time.  Skin: Rash noted. There is erythema.  Widespread, maculopapular, erythematous rash noted to the trunk, both upper and lower cavities. Mildly excoriated.  Psychiatric: She has a normal mood and affect.          Assessment & Plan:  Andrea Willis was seen today for rash and medication refill.  Diagnoses and associated orders for this visit:  Allergic dermatitis - methylPREDNISolone acetate (DEPO-MEDROL) injection 80 mg; Inject 2 mLs (80 mg total) into the muscle once.  ADD (attention deficit disorder)  Generalized anxiety disorder  Other Orders - amphetamine-dextroamphetamine (ADDERALL XR) 30 MG 24 hr  capsule; Take 1 capsule (30 mg total) by mouth every morning. - clonazePAM (KLONOPIN) 0.5 MG tablet; Take 1 tablet (0.5 mg total) by mouth 2 (two) times daily as needed for anxiety. - amphetamine-dextroamphetamine (ADDERALL XR) 30 MG 24 hr capsule; Take 1 capsule (30 mg total) by mouth every morning.   Call with any questions or concerns. Recheck as scheduled  and as needed.

## 2013-11-29 IMAGING — CR DG SHOULDER 2+V*R*
4 series · 4 of 4 positions shown · non-contrast
Comparison: None.

CLINICAL DATA: Fall off swing onto right shoulder.  Shoulder pain.

RIGHT SHOULDER - 2+ VIEW

[w shoulder ap internal righ]
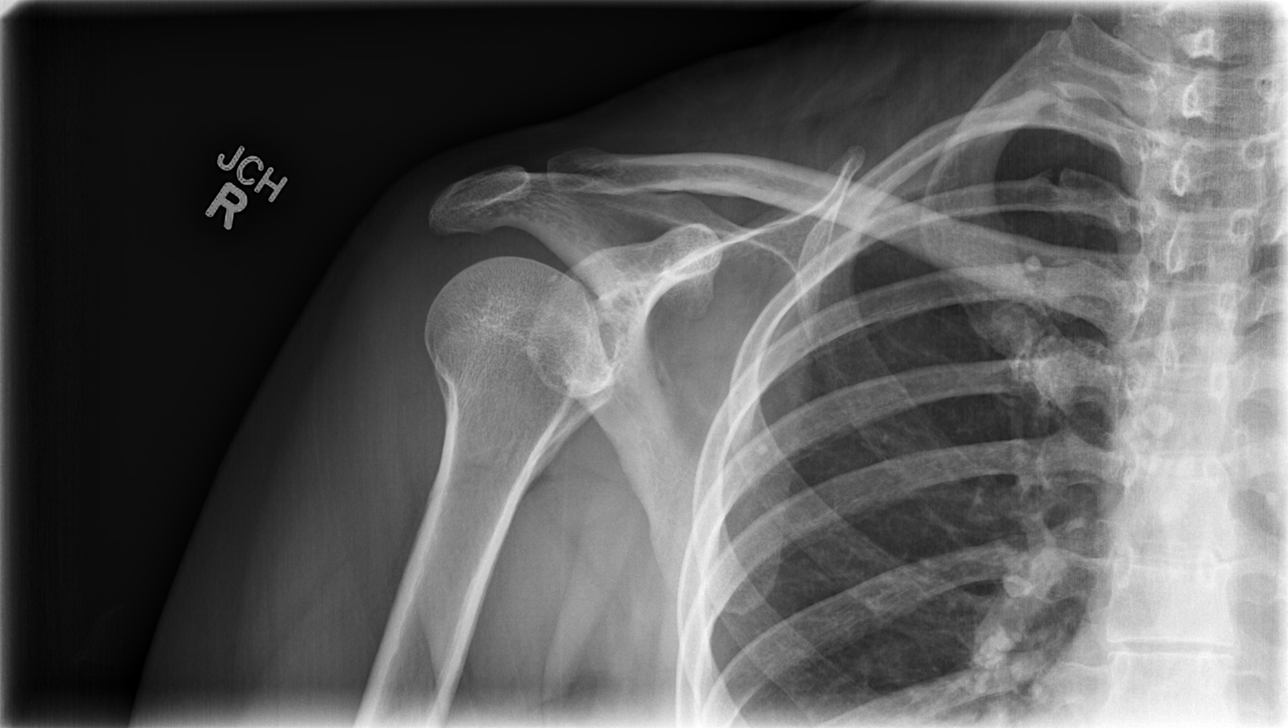

[w shoulder ap external righ]
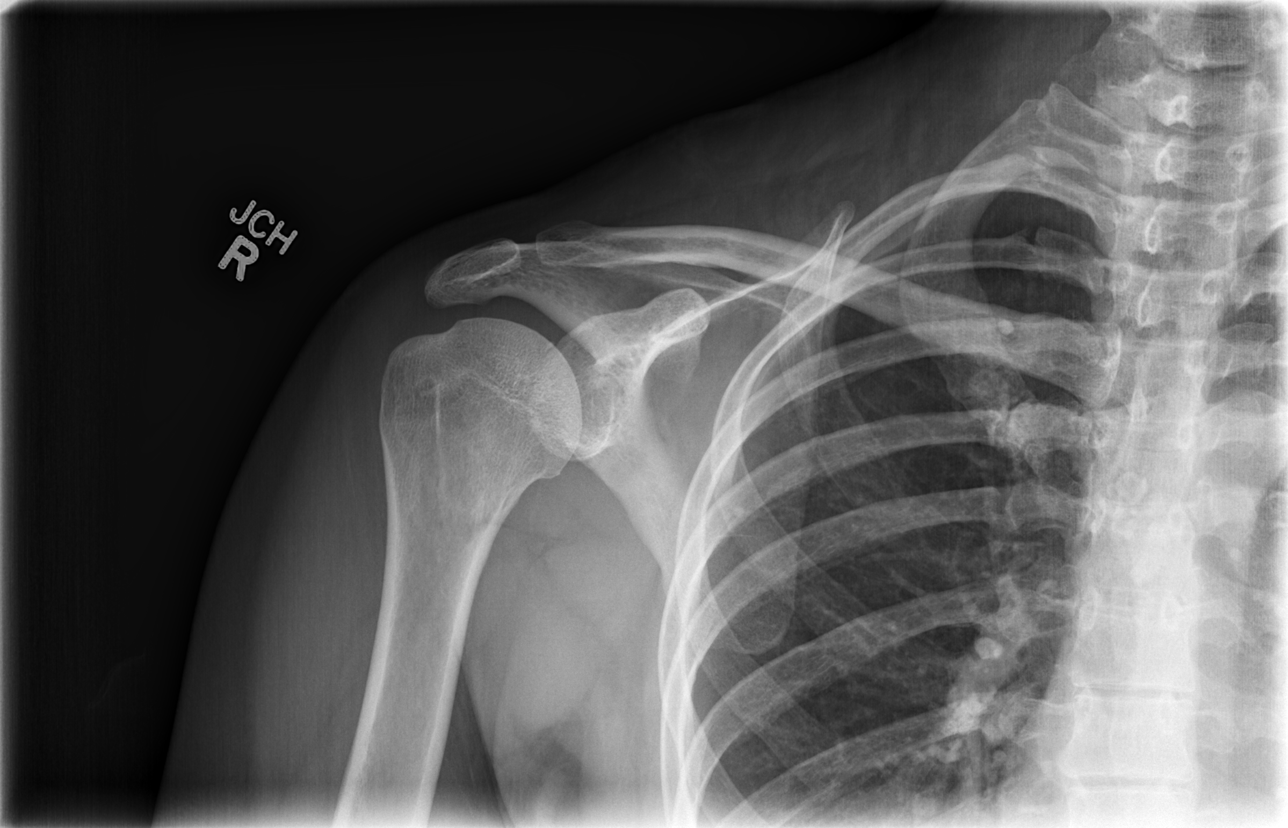

[w shoulder y view right (1 of 2)]
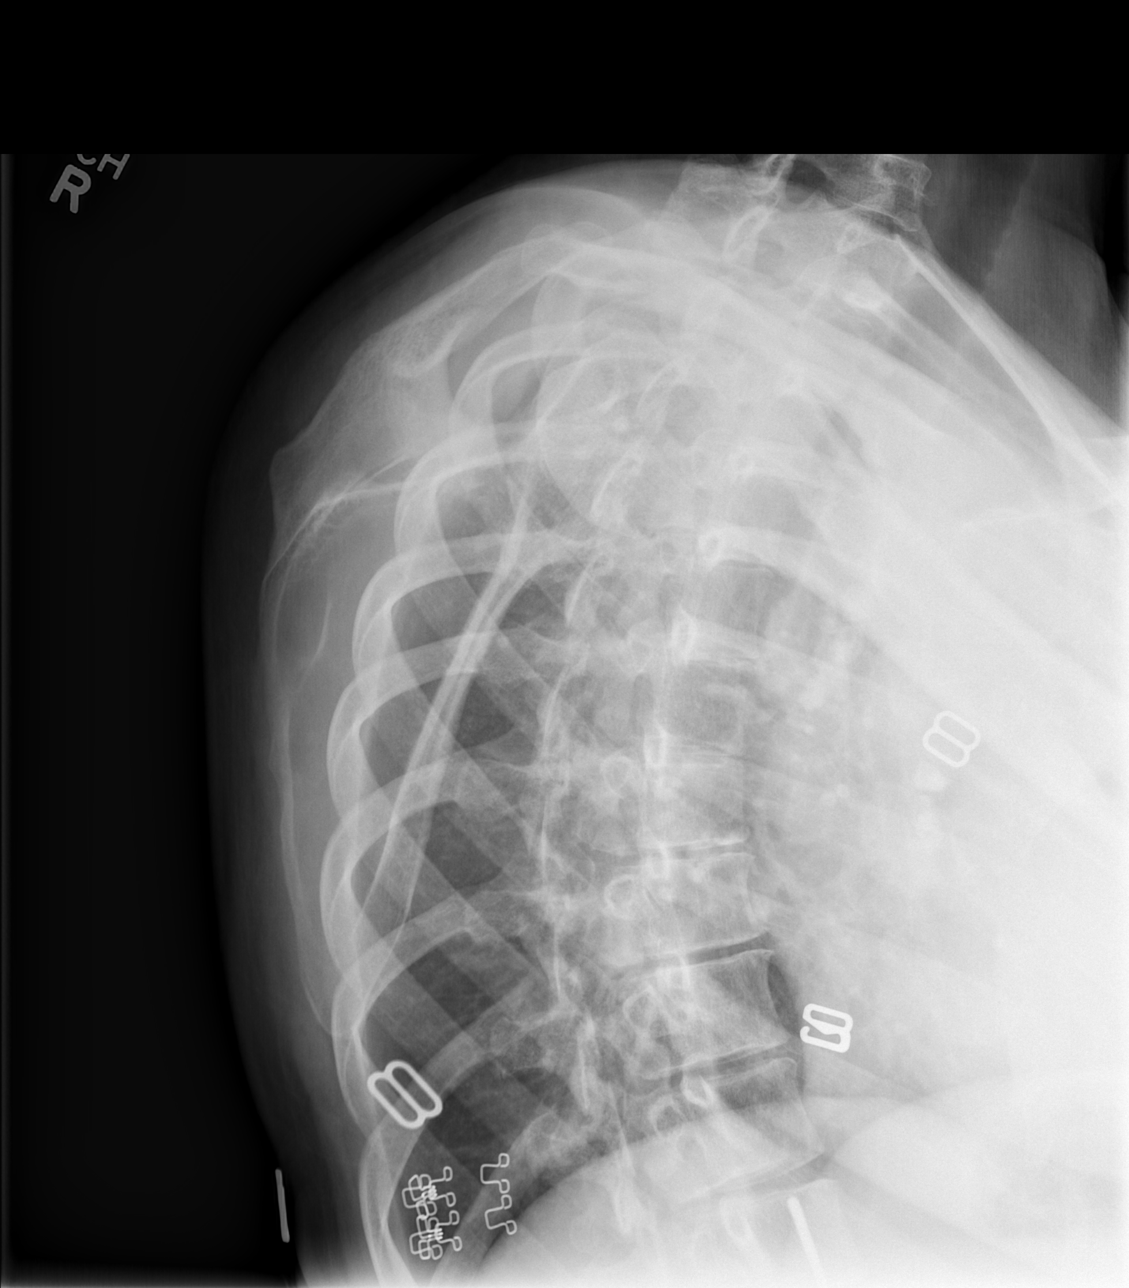

[w shoulder y view right (2 of 2)]
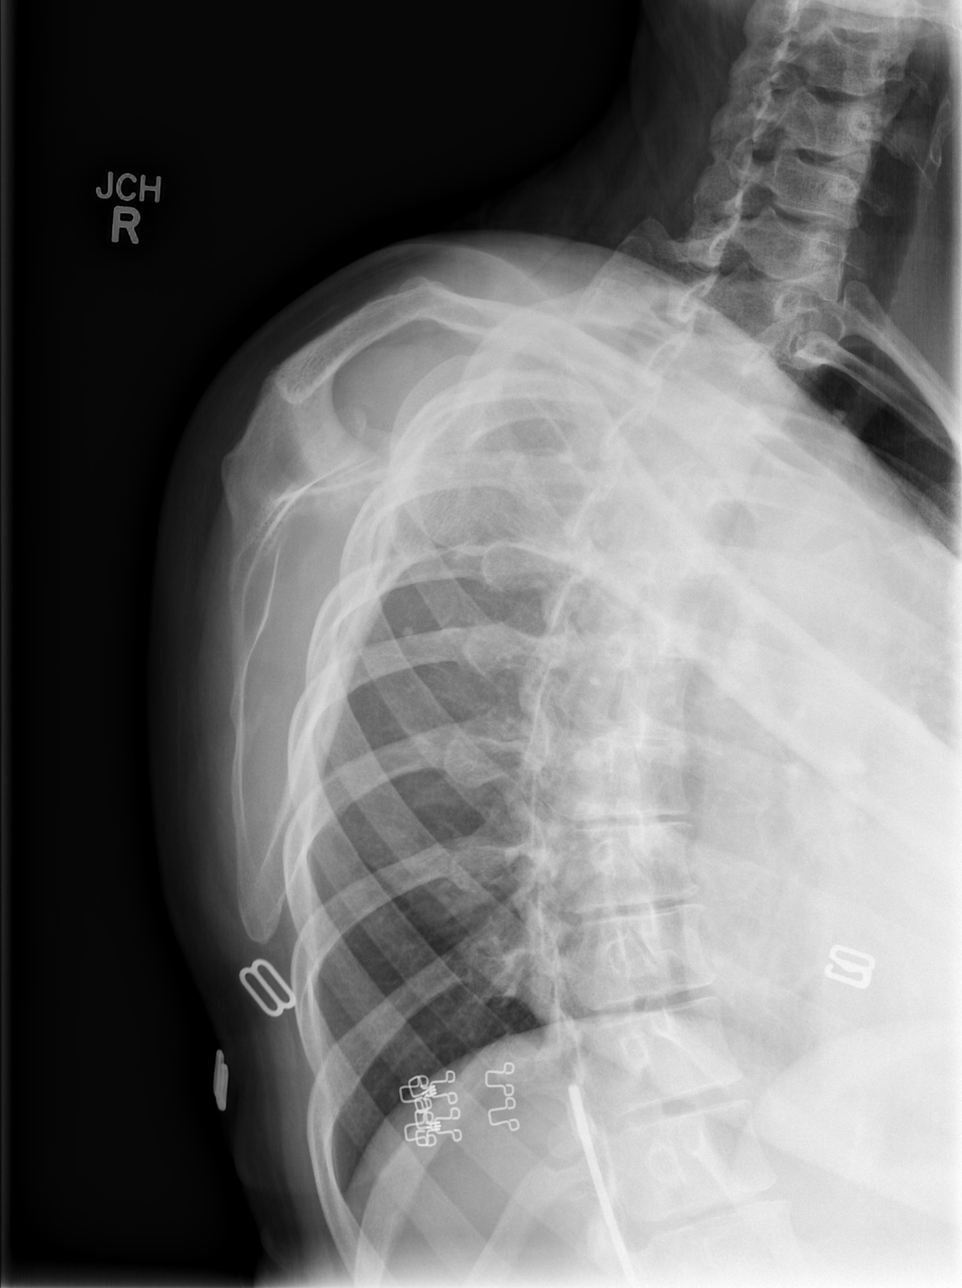

[4 of 4 positions shown; findings below may reference images not displayed]

FINDINGS: There is no evidence of fracture or dislocation.  There
is no evidence of arthropathy or other focal bone abnormality.
Soft tissues are unremarkable.
IMPRESSION: Negative.

## 2014-01-15 ENCOUNTER — Telehealth: Payer: Self-pay | Admitting: Family

## 2014-01-15 NOTE — Telephone Encounter (Signed)
I called pt to inform her of the OV needed, she was not satisfied and wants you to call her back.

## 2014-01-15 NOTE — Telephone Encounter (Signed)
Pt states she wants to have a urinalysis and meds, she says she has a UTI.  She does not want to schedule an OV and pay a $25 co-pay. Please advise.

## 2014-01-15 NOTE — Telephone Encounter (Signed)
Pt needs OV 

## 2014-01-15 NOTE — Telephone Encounter (Signed)
Spoke with pt and she states that she went through her GYN to get the abx for UTI

## 2014-02-07 ENCOUNTER — Telehealth: Payer: Self-pay | Admitting: Family

## 2014-02-07 NOTE — Telephone Encounter (Signed)
Pt is needing new rx for amphetamine-dextroamphetamine (ADDERALL XR) 30 MG 24 hr capsule, please call when available for pick up.

## 2014-02-07 NOTE — Telephone Encounter (Signed)
Please call pt to schedule follow up appointment. She has not been seen since March

## 2014-02-08 NOTE — Telephone Encounter (Addendum)
Pt is leaving early Friday am and will be gone all next week. pt is out of her meds. Pt states she did not know she needed an appt. Scheduled for the first day she is back on july 7.

## 2014-02-09 NOTE — Telephone Encounter (Signed)
Per Oran ReinPadonda, pt will not get Rx until she is seen in the office. Pt has done this several times before and it will not be tolerated. She should have regular scheduled 3 month follow up for adhd med

## 2014-02-19 ENCOUNTER — Ambulatory Visit: Payer: BC Managed Care – PPO | Admitting: Family

## 2014-02-23 ENCOUNTER — Encounter: Payer: Self-pay | Admitting: Family

## 2014-02-23 ENCOUNTER — Ambulatory Visit (INDEPENDENT_AMBULATORY_CARE_PROVIDER_SITE_OTHER): Payer: BC Managed Care – PPO | Admitting: Family

## 2014-02-23 VITALS — BP 118/68 | HR 90 | Wt 144.0 lb

## 2014-02-23 DIAGNOSIS — F988 Other specified behavioral and emotional disorders with onset usually occurring in childhood and adolescence: Secondary | ICD-10-CM

## 2014-02-23 MED ORDER — AMPHETAMINE-DEXTROAMPHET ER 30 MG PO CP24: 30.0000 mg | ORAL_CAPSULE | ORAL | Status: DC

## 2014-02-23 NOTE — Progress Notes (Signed)
Pre visit review using our clinic review tool, if applicable. No additional management support is needed unless otherwise documented below in the visit note. 

## 2014-02-23 NOTE — Progress Notes (Signed)
Subjective:    Patient ID: Andrea Willis, female    DOB: 1976/01/08, 38 y.o.   MRN: [id number]  HPI 38 year old white female, nonsmoker is in today for recheck attention deficit disorder. Currently stable on medication and tolerating it well.   Review of Systems  Constitutional: Negative.   Respiratory: Negative.   Cardiovascular: Negative.   Gastrointestinal: Negative.   Endocrine: Negative.   Musculoskeletal: Negative.   Skin: Negative.   Neurological: Negative.   Psychiatric/Behavioral: Negative.    Past Medical History  Diagnosis Date  . Attention deficit hyperactivity disorder (ADHD)     History   Social History  . Marital Status: Married    Spouse Name: N/A    Number of Children: N/A  . Years of Education: N/A   Occupational History  . Not on file.   Social History Main Topics  . Smoking status: Never Smoker   . Smokeless tobacco: Not on file  . Alcohol Use: Yes  . Drug Use: No  . Sexual Activity: Not on file   Other Topics Concern  . Not on file   Social History Narrative   ** Merged History Encounter **        History reviewed. No pertinent past surgical history.  No family history on file.  No Known Allergies  Current Outpatient Prescriptions on File Prior to Visit  Medication Sig Dispense Refill  . clonazePAM (KLONOPIN) 0.5 MG tablet Take 1 tablet (0.5 mg total) by mouth 2 (two) times daily as needed for anxiety.  60 tablet  0  . CRANBERRY PO Take 1 capsule by mouth daily.      Marland Kitchen CRANBERRY PO Take 2 tablets by mouth daily.      . fish oil-omega-3 fatty acids 1000 MG capsule Take 1 g by mouth daily.      . fluconazole (DIFLUCAN) 150 MG tablet Take 1 tablet (150 mg total) by mouth once.  1 tablet  1  . ibuprofen (ADVIL,MOTRIN) 200 MG tablet Take 800 mg by mouth every 6 (six) hours as needed. Patient used this medication for her headache.      . loratadine (CLARITIN) 10 MG tablet Take 10 mg by mouth daily.      . Multiple Vitamin  (MULTIVITAMIN) tablet Take 1 tablet by mouth daily.      . Multiple Vitamins-Minerals (MULTIVITAMINS THER. W/MINERALS) TABS Take 1 tablet by mouth daily.      . Omega-3 Fatty Acids (FISH OIL PO) Take 1 capsule by mouth daily.      Marland Kitchen sulfamethoxazole-trimethoprim (BACTRIM DS) 800-160 MG per tablet Take 1 tablet by mouth 2 (two) times daily.  14 tablet  0   No current facility-administered medications on file prior to visit.    BP 118/68  Pulse 90  Wt 144 lb (65.318 kg)chart    Objective:   Physical Exam  Constitutional: She is oriented to person, place, and time. She appears well-developed and well-nourished.  Neck: Normal range of motion. Neck supple.  Cardiovascular: Normal rate, regular rhythm and normal heart sounds.   Pulmonary/Chest: Effort normal and breath sounds normal.  Musculoskeletal: Normal range of motion.  Neurological: She is alert and oriented to person, place, and time.  Skin: Skin is warm and dry.  Psychiatric: She has a normal mood and affect.          Assessment & Plan:  October was seen today for adhd.  Diagnoses and associated orders for this visit:  ADD (attention deficit disorder)  Other Orders -  amphetamine-dextroamphetamine (ADDERALL XR) 30 MG 24 hr capsule; Take 1 capsule (30 mg total) by mouth every morning. - amphetamine-dextroamphetamine (ADDERALL XR) 30 MG 24 hr capsule; Take 1 capsule (30 mg total) by mouth every morning. - amphetamine-dextroamphetamine (ADDERALL XR) 30 MG 24 hr capsule; Take 1 capsule (30 mg total) by mouth every morning.   Call the office with any questions or concerns.

## 2014-02-23 NOTE — Patient Instructions (Signed)
Exercise to Stay Healthy Exercise helps you become and stay healthy. EXERCISE IDEAS AND TIPS Choose exercises that:  You enjoy.  Fit into your day. You do not need to exercise really hard to be healthy. You can do exercises at a slow or medium level and stay healthy. You can:  Stretch before and after working out.  Try yoga, Pilates, or tai chi.  Lift weights.  Walk fast, swim, jog, run, climb stairs, bicycle, dance, or rollerskate.  Take aerobic classes. Exercises that burn about 150 calories:  Running 1  miles in 15 minutes.  Playing volleyball for 45 to 60 minutes.  Washing and waxing a car for 45 to 60 minutes.  Playing touch football for 45 minutes.  Walking 1  miles in 35 minutes.  Pushing a stroller 1  miles in 30 minutes.  Playing basketball for 30 minutes.  Raking leaves for 30 minutes.  Bicycling 5 miles in 30 minutes.  Walking 2 miles in 30 minutes.  Dancing for 30 minutes.  Shoveling snow for 15 minutes.  Swimming laps for 20 minutes.  Walking up stairs for 15 minutes.  Bicycling 4 miles in 15 minutes.  Gardening for 30 to 45 minutes.  Jumping rope for 15 minutes.  Washing windows or floors for 45 to 60 minutes. Document Released: 09/05/2010 Document Revised: 10/26/2011 Document Reviewed: 09/05/2010 [employer] Patient Information 2015 [employer]. This information is not intended to replace advice given to you by your health care provider. Make sure you discuss any questions you have with your health care provider.  

## 2014-03-01 ENCOUNTER — Telehealth: Payer: Self-pay | Admitting: Family

## 2014-03-01 MED ORDER — CLONAZEPAM 0.5 MG PO TABS: 0.5000 mg | ORAL_TABLET | Freq: Two times a day (BID) | ORAL | Status: DC | PRN

## 2014-03-01 NOTE — Telephone Encounter (Signed)
Done and pt aware

## 2014-03-01 NOTE — Telephone Encounter (Signed)
Pt is needing new rx clonazePAM (KLONOPIN) 0.5 MG tablet, send to [facility].

## 2014-03-06 ENCOUNTER — Ambulatory Visit: Payer: BC Managed Care – PPO | Admitting: Family

## 2014-06-19 ENCOUNTER — Other Ambulatory Visit (INDEPENDENT_AMBULATORY_CARE_PROVIDER_SITE_OTHER): Payer: BC Managed Care – PPO

## 2014-06-19 DIAGNOSIS — Z0000 Encounter for general adult medical examination without abnormal findings: Secondary | ICD-10-CM

## 2014-06-19 LAB — BASIC METABOLIC PANEL
BUN: 19 mg/dL (ref 6–23)
CHLORIDE: 105 meq/L (ref 96–112)
CO2: 28 mEq/L (ref 19–32)
Calcium: 9 mg/dL (ref 8.4–10.5)
Creatinine, Ser: 0.9 mg/dL (ref 0.4–1.2)
GFR: 72.63 mL/min (ref >60.00)
Glucose, Bld: 86 mg/dL (ref 70–99)
Potassium: 4.7 mEq/L (ref 3.5–5.1)
SODIUM: 140 meq/L (ref 135–145)

## 2014-06-19 LAB — CBC WITH DIFFERENTIAL/PLATELET
BASOS PCT: 0.4 % (ref 0.0–3.0)
Basophils Absolute: 0 10*3/uL (ref 0.0–0.1)
Eosinophils Absolute: 0.3 10*3/uL (ref 0.0–0.7)
Eosinophils Relative: 4.4 % (ref 0.0–5.0)
HCT: 37.8 % (ref 36.0–46.0)
Hemoglobin: 12.4 g/dL (ref 12.0–15.0)
LYMPHS ABS: 1.9 10*3/uL (ref 0.7–4.0)
Lymphocytes Relative: 28.4 % (ref 12.0–46.0)
MCHC: 32.8 g/dL (ref 30.0–36.0)
MCV: 97.4 fl (ref 78.0–100.0)
Monocytes Absolute: 0.5 10*3/uL (ref 0.1–1.0)
Monocytes Relative: 7.6 % (ref 3.0–12.0)
Neutro Abs: 4 10*3/uL (ref 1.4–7.7)
Neutrophils Relative %: 59.2 % (ref 43.0–77.0)
Platelets: 234 10*3/uL (ref 150.0–400.0)
RBC: 3.88 Mil/uL (ref 3.87–5.11)
RDW: 13.8 % (ref 11.5–15.5)
WBC: 6.8 10*3/uL (ref 4.0–10.5)

## 2014-06-19 LAB — HEPATIC FUNCTION PANEL
ALT: 19 U/L (ref 0–35)
AST: 23 U/L (ref 0–37)
Albumin: 3.5 g/dL (ref 3.5–5.2)
Alkaline Phosphatase: 59 U/L (ref 39–117)
BILIRUBIN DIRECT: 0.1 mg/dL (ref 0.0–0.3)
TOTAL PROTEIN: 6.3 g/dL (ref 6.0–8.3)
Total Bilirubin: 0.8 mg/dL (ref 0.2–1.2)

## 2014-06-19 LAB — POCT URINALYSIS DIPSTICK
Bilirubin, UA: NEGATIVE
Glucose, UA: NEGATIVE
KETONES UA: NEGATIVE
Leukocytes, UA: NEGATIVE
Nitrite, UA: NEGATIVE
PH UA: 6.5
Protein, UA: NEGATIVE
RBC UA: NEGATIVE
Spec Grav, UA: 1.015
Urobilinogen, UA: 0.2

## 2014-06-19 LAB — LIPID PANEL
CHOL/HDL RATIO: 2
Cholesterol: 190 mg/dL (ref 0–200)
HDL: 95.8 mg/dL (ref >39.00)
LDL CALC: 87 mg/dL (ref 0–99)
NONHDL: 94.2
Triglycerides: 36 mg/dL (ref 0.0–149.0)
VLDL: 7.2 mg/dL (ref 0.0–40.0)

## 2014-06-19 LAB — TSH: TSH: 1.01 u[IU]/mL (ref 0.35–4.50)

## 2014-06-26 ENCOUNTER — Encounter: Payer: Self-pay | Admitting: Family

## 2014-06-26 ENCOUNTER — Ambulatory Visit (INDEPENDENT_AMBULATORY_CARE_PROVIDER_SITE_OTHER): Payer: BC Managed Care – PPO | Admitting: Family

## 2014-06-26 VITALS — BP 96/60 | HR 87 | Ht 63.0 in | Wt 149.0 lb

## 2014-06-26 DIAGNOSIS — Z0000 Encounter for general adult medical examination without abnormal findings: Secondary | ICD-10-CM

## 2014-06-26 DIAGNOSIS — F909 Attention-deficit hyperactivity disorder, unspecified type: Secondary | ICD-10-CM

## 2014-06-26 DIAGNOSIS — F988 Other specified behavioral and emotional disorders with onset usually occurring in childhood and adolescence: Secondary | ICD-10-CM

## 2014-06-26 MED ORDER — AMPHETAMINE-DEXTROAMPHET ER 20 MG PO CP24: 20.0000 mg | ORAL_CAPSULE | ORAL | Status: DC

## 2014-06-26 NOTE — Patient Instructions (Signed)
Cardiac Diet This diet can help prevent heart disease and stroke. Many factors influence your heart health, including eating and exercise habits. Coronary risk rises a lot with abnormal blood fat (lipid) levels. Cardiac meal planning includes limiting unhealthy fats, increasing healthy fats, and making other small dietary changes. General guidelines are as follows:  Adjust calorie intake to reach and maintain desirable body weight.  Limit total fat intake to less than 30% of total calories. Saturated fat should be less than 7% of calories.  Saturated fats are found in animal products and in some vegetable products. Saturated vegetable fats are found in coconut oil, cocoa butter, palm oil, and palm kernel oil. Read labels carefully to avoid these products as much as possible. Use butter in moderation. Choose tub margarines and oils that have 2 grams of fat or less. Good cooking oils are canola and olive oils.  Practice low-fat cooking techniques. Do not fry food. Instead, broil, bake, boil, steam, grill, roast on a rack, stir-fry, or microwave it. Other fat reducing suggestions include:  Remove the skin from poultry.  Remove all visible fat from meats.  Skim the fat off stews, soups, and gravies before serving them.  Steam vegetables in water or broth instead of sauting them in fat.  Avoid foods with trans fat (or hydrogenated oils), such as commercially fried foods and commercially baked goods. Commercial shortening and deep-frying fats will contain trans fat.  Increase intake of fruits, vegetables, whole grains, and legumes to replace foods high in fat.  Increase consumption of nuts, legumes, and seeds to at least 4 servings weekly. One serving of a legume equals  cup, and 1 serving of nuts or seeds equals  cup.  Choose whole grains more often. Have 3 servings per day (a serving is 1 ounce [oz]).  Eat 4 to 5 servings of vegetables per day. A serving of vegetables is 1 cup of raw leafy  vegetables;  cup of raw or cooked cut-up vegetables;  cup of vegetable juice.  Eat 4 to 5 servings of fruit per day. A serving of fruit is 1 medium whole fruit;  cup of dried fruit;  cup of fresh, frozen, or canned fruit;  cup of 100% fruit juice.  Increase your intake of dietary fiber to 20 to 30 grams per day. Insoluble fiber may help lower your risk of heart disease and may help curb your appetite.  Soluble fiber binds cholesterol to be removed from the blood. Foods high in soluble fiber are dried beans, citrus fruits, oats, apples, bananas, broccoli, Brussels sprouts, and eggplant.  Try to include foods fortified with plant sterols or stanols, such as yogurt, breads, juices, or margarines. Choose several fortified foods to achieve a daily intake of 2 to 3 grams of plant sterols or stanols.  Foods with omega-3 fats can help reduce your risk of heart disease. Aim to have a 3.5 oz portion of fatty fish twice per week, such as salmon, mackerel, albacore tuna, sardines, lake trout, or herring. If you wish to take a fish oil supplement, choose one that contains 1 gram of both DHA and EPA.  Limit processed meats to 2 servings (3 oz portion) weekly.  Limit the sodium in your diet to 1500 milligrams (mg) per day. If you have high blood pressure, talk to a registered dietitian about a DASH (Dietary Approaches to Stop Hypertension) eating plan.  Limit sweets and beverages with added sugar, such as soda, to no more than 5 servings per week. One   serving is:   1 tablespoon sugar.  1 tablespoon jelly or jam.   cup sorbet.  1 cup lemonade.   cup regular soda. CHOOSING FOODS Starches  Allowed: Breads: All kinds (wheat, rye, raisin, white, oatmeal, Italian, French, and English muffin bread). Low-fat rolls: English muffins, frankfurter and hamburger buns, bagels, pita bread, tortillas (not fried). Pancakes, waffles, biscuits, and muffins made with recommended oil.  Avoid: Products made with  saturated or trans fats, oils, or whole milk products. Butter rolls, cheese breads, croissants. Commercial doughnuts, muffins, sweet rolls, biscuits, waffles, pancakes, store-bought mixes. Crackers  Allowed: Low-fat crackers and snacks: Animal, graham, rye, saltine (with recommended oil, no lard), oyster, and matzo crackers. Bread sticks, melba toast, rusks, flatbread, pretzels, and light popcorn.  Avoid: High-fat crackers: cheese crackers, butter crackers, and those made with coconut, palm oil, or trans fat (hydrogenated oils). Buttered popcorn. Cereals  Allowed: Hot or cold whole-grain cereals.  Avoid: Cereals containing coconut, hydrogenated vegetable fat, or animal fat. Potatoes / Pasta / Rice  Allowed: All kinds of potatoes, rice, and pasta (such as macaroni, spaghetti, and noodles).  Avoid: Pasta or rice prepared with cream sauce or high-fat cheese. Chow mein noodles, French fries. Vegetables  Allowed: All vegetables and vegetable juices.  Avoid: Fried vegetables. Vegetables in cream, butter, or high-fat cheese sauces. Limit coconut. Fruit in cream or custard. Protein  Allowed: Limit your intake of meat, seafood, and poultry to no more than 6 oz (cooked weight) per day. All lean, well-trimmed beef, veal, pork, and lamb. All chicken and turkey without skin. All fish and shellfish. Wild game: wild duck, rabbit, pheasant, and venison. Egg whites or low-cholesterol egg substitutes may be used as desired. Meatless dishes: recipes with dried beans, peas, lentils, and tofu (soybean curd). Seeds and nuts: all seeds and most nuts.  Avoid: Prime grade and other heavily marbled and fatty meats, such as short ribs, spare ribs, rib eye roast or steak, frankfurters, sausage, bacon, and high-fat luncheon meats, mutton. Caviar. Commercially fried fish. Domestic duck, goose, venison sausage. Organ meats: liver, gizzard, heart, chitterlings, brains, kidney, sweetbreads. Dairy  Allowed: Low-fat  cheeses: nonfat or low-fat cottage cheese (1% or 2% fat), cheeses made with part skim milk, such as mozzarella, farmers, string, or ricotta. (Cheeses should be labeled no more than 2 to 6 grams fat per oz.). Skim (or 1%) milk: liquid, powdered, or evaporated. Buttermilk made with low-fat milk. Drinks made with skim or low-fat milk or cocoa. Chocolate milk or cocoa made with skim or low-fat (1%) milk. Nonfat or low-fat yogurt.  Avoid: Whole milk cheeses, including colby, cheddar, muenster, Monterey Jack, Havarti, Brie, Camembert, American, Swiss, and blue. Creamed cottage cheese, cream cheese. Whole milk and whole milk products, including buttermilk or yogurt made from whole milk, drinks made from whole milk. Condensed milk, evaporated whole milk, and 2% milk. Soups and Combination Foods  Allowed: Low-fat low-sodium soups: broth, dehydrated soups, homemade broth, soups with the fat removed, homemade cream soups made with skim or low-fat milk. Low-fat spaghetti, lasagna, chili, and Spanish rice if low-fat ingredients and low-fat cooking techniques are used.  Avoid: Cream soups made with whole milk, cream, or high-fat cheese. All other soups. Desserts and Sweets  Allowed: Sherbet, fruit ices, gelatins, meringues, and angel food cake. Homemade desserts with recommended fats, oils, and milk products. Jam, jelly, honey, marmalade, sugars, and syrups. Pure sugar candy, such as gum drops, hard candy, jelly beans, marshmallows, mints, and small amounts of dark chocolate.  Avoid: Commercially prepared   cakes, pies, cookies, frosting, pudding, or mixes for these products. Desserts containing whole milk products, chocolate, coconut, lard, palm oil, or palm kernel oil. Ice cream or ice cream drinks. Candy that contains chocolate, coconut, butter, hydrogenated fat, or unknown ingredients. Buttered syrups. Fats and Oils  Allowed: Vegetable oils: safflower, sunflower, corn, soybean, cottonseed, sesame, canola, olive,  or peanut. Non-hydrogenated margarines. Salad dressing or mayonnaise: homemade or commercial, made with a recommended oil. Low or nonfat salad dressing or mayonnaise.  Limit added fats and oils to 6 to 8 tsp per day (includes fats used in cooking, baking, salads, and spreads on bread). Remember to count the "hidden fats" in foods.  Avoid: Solid fats and shortenings: butter, lard, salt pork, bacon drippings. Gravy containing meat fat, shortening, or suet. Cocoa butter, coconut. Coconut oil, palm oil, palm kernel oil, or hydrogenated oils: these ingredients are often used in bakery products, nondairy creamers, whipped toppings, candy, and commercially fried foods. Read labels carefully. Salad dressings made of unknown oils, sour cream, or cheese, such as blue cheese and Roquefort. Cream, all kinds: half-and-half, light, heavy, or whipping. Sour cream or cream cheese (even if "light" or low-fat). Nondairy cream substitutes: coffee creamers and sour cream substitutes made with palm, palm kernel, hydrogenated oils, or coconut oil. Beverages  Allowed: Coffee (regular or decaffeinated), tea. Diet carbonated beverages, mineral water. Alcohol: Check with your caregiver. Moderation is recommended.  Avoid: Whole milk, regular sodas, and juice drinks with added sugar. Condiments  Allowed: All seasonings and condiments. Cocoa powder. "Cream" sauces made with recommended ingredients.  Avoid: Carob powder made with hydrogenated fats. SAMPLE MENU Breakfast   cup orange juice   cup oatmeal  1 slice toast  1 tsp margarine  1 cup skim milk Lunch  Turkey sandwich with 2 oz turkey, 2 slices bread  Lettuce and tomato slices  Fresh fruit  Carrot sticks  Coffee or tea Snack  Fresh fruit or low-fat crackers Dinner  3 oz lean ground beef  1 baked potato  1 tsp margarine   cup asparagus  Lettuce salad  1 tbs non-creamy dressing   cup peach slices  1 cup skim milk Document Released:  05/12/2008 Document Revised: 02/02/2012 Document Reviewed: 10/03/2013 [employer] Patient Information 2015 [employer]. This information is not intended to replace advice given to you by your health care provider. Make sure you discuss any questions you have with your health care provider.  

## 2014-06-26 NOTE — Progress Notes (Signed)
Subjective:    Patient ID: Andrea Willis, female    DOB: 08-Dec-1975, 38 y.o.   MRN: [id number]  HPI 38 year old female, nonsmoker is in today for complete physical exam. She sees gynecology for Pap and pelvics. Denies any concerns. Exercises daily. Tries to follow healthy diet.   Review of Systems  Constitutional: Negative.   HENT: Negative.   Eyes: Negative.   Respiratory: Negative.   Cardiovascular: Negative.   Gastrointestinal: Negative.   Endocrine: Negative.   Genitourinary: Negative.   Musculoskeletal: Negative.   Skin: Negative.   Allergic/Immunologic: Negative.   Neurological: Negative.   Hematological: Negative.   Psychiatric/Behavioral: Negative.    Past Medical History  Diagnosis Date  . Attention deficit hyperactivity disorder (ADHD)     History   Social History  . Marital Status: Married    Spouse Name: N/A    Number of Children: N/A  . Years of Education: N/A   Occupational History  . Not on file.   Social History Main Topics  . Smoking status: Never Smoker   . Smokeless tobacco: Not on file  . Alcohol Use: Yes  . Drug Use: No  . Sexual Activity: Not on file   Other Topics Concern  . Not on file   Social History Narrative   ** Merged History Encounter **        History reviewed. No pertinent past surgical history.  History reviewed. No pertinent family history.  No Known Allergies  Current Outpatient Prescriptions on File Prior to Visit  Medication Sig Dispense Refill  . clonazePAM (KLONOPIN) 0.5 MG tablet Take 1 tablet (0.5 mg total) by mouth 2 (two) times daily as needed for anxiety. 60 tablet 0  . fish oil-omega-3 fatty acids 1000 MG capsule Take 1 g by mouth daily.    Marland Kitchen. ibuprofen (ADVIL,MOTRIN) 200 MG tablet Take 800 mg by mouth every 6 (six) hours as needed. Patient used this medication for her headache.    . loratadine (CLARITIN) 10 MG tablet Take 10 mg by mouth daily.    . Multiple Vitamins-Minerals (MULTIVITAMINS THER.  W/MINERALS) TABS Take 1 tablet by mouth daily.     No current facility-administered medications on file prior to visit.    BP 96/60 mmHg  Pulse 87  Ht 5\' 3"  (1.6 m)  Wt 149 lb (67.586 kg)  BMI 26.40 kg/m2chart    Objective:   Physical Exam  Constitutional: She is oriented to person, place, and time. She appears well-developed and well-nourished.  HENT:  Right Ear: External ear normal.  Left Ear: External ear normal.  Nose: Nose normal.  Mouth/Throat: Oropharynx is clear and moist.  Neck: Normal range of motion. Neck supple. No thyromegaly present.  Cardiovascular: Normal rate, regular rhythm and normal heart sounds.   Pulmonary/Chest: Effort normal and breath sounds normal.  Abdominal: Soft. Bowel sounds are normal.  Musculoskeletal: Normal range of motion.  Neurological: She is alert and oriented to person, place, and time.  Skin: Skin is warm and dry.  Psychiatric: She has a normal mood and affect.          Assessment & Plan:  Andrea Willis was seen today for annual exam.  Diagnoses and associated orders for this visit:  Preventative health care  ADD (attention deficit disorder)  Other Orders - amphetamine-dextroamphetamine (ADDERALL XR) 20 MG 24 hr capsule; Take 1 capsule (20 mg total) by mouth every morning. - amphetamine-dextroamphetamine (ADDERALL XR) 20 MG 24 hr capsule; Take 1 capsule (20 mg total) by mouth every  morning. - amphetamine-dextroamphetamine (ADDERALL XR) 20 MG 24 hr capsule; Take 1 capsule (20 mg total) by mouth every morning.   call the office with any questions or concerns. Recheck  In 3 months and sooner as needed.

## 2014-07-20 ENCOUNTER — Telehealth: Payer: Self-pay | Admitting: Family

## 2014-07-20 NOTE — Telephone Encounter (Signed)
Pt called to say that she is currently taking amphetamine-dextroamphetamine (ADDERALL XR) 20 MG 24 hr capsule   Pt called to say that 20mg  is to much and is requesting for 10mg  extended release

## 2014-07-20 NOTE — Telephone Encounter (Signed)
S/w pt letting her know that she need to return the rx and she said she would

## 2014-07-20 NOTE — Telephone Encounter (Signed)
Pt needs to return 20mg  prescriptions to the office before others will be dispensed

## 2014-07-24 ENCOUNTER — Telehealth: Payer: Self-pay | Admitting: Family

## 2014-07-24 MED ORDER — CLONAZEPAM 0.5 MG PO TABS: 0.5000 mg | ORAL_TABLET | Freq: Two times a day (BID) | ORAL | Status: DC | PRN

## 2014-07-24 NOTE — Telephone Encounter (Signed)
Pt request refill clonazePAM (KLONOPIN) 0.5 MG tablet [facility]

## 2014-07-24 NOTE — Telephone Encounter (Signed)
Rx phoned in.   

## 2014-08-02 ENCOUNTER — Other Ambulatory Visit: Payer: Self-pay

## 2014-08-02 MED ORDER — AMPHETAMINE-DEXTROAMPHET ER 10 MG PO CP24: 10.0000 mg | ORAL_CAPSULE | Freq: Every day | ORAL | Status: AC

## 2014-11-23 ENCOUNTER — Other Ambulatory Visit: Payer: Self-pay | Admitting: Family

## 2014-12-26 ENCOUNTER — Telehealth: Payer: Self-pay | Admitting: Family

## 2014-12-26 NOTE — Telephone Encounter (Signed)
Denied. Pt has not been seen since November. She also needs to schedule appointment to establish with another provider

## 2014-12-26 NOTE — Telephone Encounter (Signed)
S/w pt she said her insurance has changes and she did not think we were part of her insurance plan and she may have to change doctors

## 2014-12-26 NOTE — Telephone Encounter (Signed)
° ° °  Pt request refill of the following: ° ° °amphetamine-dextroamphetamine (ADDERALL XR) 10 MG 24 hr capsule ° °Phamacy: °
